# Patient Record
Sex: Female | Born: 1970 | Race: Black or African American | Hispanic: No | Smoking: Current some day smoker
Health system: Southern US, Community
[De-identification: ages and names within clinical notes are randomized; demographics above are authoritative.]

## PROBLEM LIST (undated history)

## (undated) DIAGNOSIS — E785 Hyperlipidemia, unspecified: Secondary | ICD-10-CM

## (undated) DIAGNOSIS — I10 Essential (primary) hypertension: Secondary | ICD-10-CM

## (undated) HISTORY — DX: Essential (primary) hypertension: I10

## (undated) HISTORY — PX: TUBAL LIGATION: SHX77

## (undated) HISTORY — PX: ABDOMINAL SURGERY: SHX537

## (undated) HISTORY — PX: ABDOMINAL HYSTERECTOMY: SHX81

## (undated) HISTORY — DX: Hyperlipidemia, unspecified: E78.5

---

## 1998-09-01 ENCOUNTER — Emergency Department (HOSPITAL_COMMUNITY): Admission: EM | Admit: 1998-09-01 | Discharge: 1998-09-02 | Payer: Self-pay | Admitting: Emergency Medicine

## 1998-09-01 ENCOUNTER — Encounter: Payer: Self-pay | Admitting: Emergency Medicine

## 2000-04-07 ENCOUNTER — Other Ambulatory Visit: Admission: RE | Admit: 2000-04-07 | Discharge: 2000-04-07 | Payer: Self-pay | Admitting: Obstetrics and Gynecology

## 2000-05-17 ENCOUNTER — Other Ambulatory Visit: Admission: RE | Admit: 2000-05-17 | Discharge: 2000-05-17 | Payer: Self-pay | Admitting: Gynecology

## 2000-10-16 ENCOUNTER — Inpatient Hospital Stay (HOSPITAL_COMMUNITY): Admission: AD | Admit: 2000-10-16 | Discharge: 2000-10-21 | Payer: Self-pay | Admitting: Gynecology

## 2000-10-17 ENCOUNTER — Encounter: Payer: Self-pay | Admitting: *Deleted

## 2000-11-30 ENCOUNTER — Other Ambulatory Visit: Admission: RE | Admit: 2000-11-30 | Discharge: 2000-11-30 | Payer: Self-pay | Admitting: Gynecology

## 2001-12-23 ENCOUNTER — Encounter: Payer: Self-pay | Admitting: Emergency Medicine

## 2001-12-23 ENCOUNTER — Emergency Department (HOSPITAL_COMMUNITY): Admission: EM | Admit: 2001-12-23 | Discharge: 2001-12-23 | Payer: Self-pay | Admitting: Emergency Medicine

## 2002-10-03 ENCOUNTER — Other Ambulatory Visit: Admission: RE | Admit: 2002-10-03 | Discharge: 2002-10-03 | Payer: Self-pay | Admitting: Gynecology

## 2003-02-19 ENCOUNTER — Inpatient Hospital Stay (HOSPITAL_COMMUNITY): Admission: AD | Admit: 2003-02-19 | Discharge: 2003-02-19 | Payer: Self-pay | Admitting: Gynecology

## 2003-02-19 ENCOUNTER — Encounter: Payer: Self-pay | Admitting: Gynecology

## 2003-03-14 ENCOUNTER — Observation Stay (HOSPITAL_COMMUNITY): Admission: RE | Admit: 2003-03-14 | Discharge: 2003-03-15 | Payer: Self-pay | Admitting: Gynecology

## 2003-03-14 ENCOUNTER — Encounter: Payer: Self-pay | Admitting: Gynecology

## 2003-03-16 ENCOUNTER — Inpatient Hospital Stay (HOSPITAL_COMMUNITY): Admission: AD | Admit: 2003-03-16 | Discharge: 2003-03-16 | Payer: Self-pay | Admitting: Gynecology

## 2003-07-17 ENCOUNTER — Encounter: Payer: Self-pay | Admitting: Family Medicine

## 2003-07-17 ENCOUNTER — Ambulatory Visit (HOSPITAL_COMMUNITY): Admission: RE | Admit: 2003-07-17 | Discharge: 2003-07-17 | Payer: Self-pay | Admitting: Family Medicine

## 2004-07-12 ENCOUNTER — Emergency Department (HOSPITAL_COMMUNITY): Admission: EM | Admit: 2004-07-12 | Discharge: 2004-07-12 | Payer: Self-pay | Admitting: Emergency Medicine

## 2012-09-25 ENCOUNTER — Emergency Department (INDEPENDENT_AMBULATORY_CARE_PROVIDER_SITE_OTHER)
Admission: EM | Admit: 2012-09-25 | Discharge: 2012-09-25 | Disposition: A | Discharge status: Home / Self Care | Payer: PRIVATE HEALTH INSURANCE | Source: Home / Self Care | Attending: Family Medicine | Admitting: Family Medicine

## 2012-09-25 ENCOUNTER — Encounter (HOSPITAL_COMMUNITY): Payer: Self-pay

## 2012-09-25 DIAGNOSIS — M62830 Muscle spasm of back: Secondary | ICD-10-CM

## 2012-09-25 DIAGNOSIS — M5380 Other specified dorsopathies, site unspecified: Secondary | ICD-10-CM

## 2012-09-25 DIAGNOSIS — B379 Candidiasis, unspecified: Secondary | ICD-10-CM

## 2012-09-25 DIAGNOSIS — B49 Unspecified mycosis: Secondary | ICD-10-CM

## 2012-09-25 MED ORDER — CYCLOBENZAPRINE HCL 10 MG PO TABS: 10.0000 mg | ORAL_TABLET | Freq: Two times a day (BID) | ORAL | Status: DC | PRN

## 2012-09-25 MED ORDER — MUPIROCIN CALCIUM 2 % EX CREA: TOPICAL_CREAM | Freq: Three times a day (TID) | CUTANEOUS | Status: DC

## 2012-09-25 MED ORDER — NYSTATIN 100000 UNIT/GM EX CREA: TOPICAL_CREAM | CUTANEOUS | Status: DC

## 2012-09-25 MED ORDER — HYDROCODONE-ACETAMINOPHEN 5-325 MG PO TABS: 2.0000 | ORAL_TABLET | ORAL | Status: AC | PRN

## 2012-09-25 NOTE — ED Notes (Signed)
C/o painful rash under breast, bilateral; no known ill contact

## 2012-09-25 NOTE — ED Provider Notes (Signed)
History     CSN: [id number]  Arrival date & time 09/25/12  1022   None     Chief Complaint  Patient presents with  . Rash    (Consider location/radiation/quality/duration/timing/severity/associated sxs/prior treatment) Patient is a 41 y.o. female presenting with rash. The history is provided by the patient. No language interpreter was used.  Rash  This is a new problem. The current episode started 2 days ago. The problem has been gradually worsening. The problem is associated with nothing. There has been no fever. The pain is moderate. The pain has been constant since onset. She has tried nothing for the symptoms. The treatment provided moderate relief.  Pt complains of a rash under bilat breast.  Pt reports rash is painful.  Pt also complains of pain in her back  History reviewed. No pertinent past medical history.  Past Surgical History  Procedure Date  . Abdominal hysterectomy     History reviewed. No pertinent family history.  History  Substance Use Topics  . Smoking status: Not on file  . Smokeless tobacco: Not on file  . Alcohol Use:     OB History    Grav Para Term Preterm Abortions TAB SAB Ect Mult Living                  Review of Systems  Musculoskeletal: Positive for back pain.  Skin: Positive for rash.  All other systems reviewed and are negative.    Allergies  Review of patient's allergies indicates no known allergies.  Home Medications  No current outpatient prescriptions on file.  BP 144/92  Pulse 95  Temp 98.3 F (36.8 C) (Oral)  Resp 12  SpO2 98%  Physical Exam  Nursing note and vitals reviewed. Constitutional: She is oriented to person, place, and time. She appears well-developed and well-nourished.  HENT:  Head: Normocephalic.  Eyes: Conjunctivae normal and EOM are normal. Pupils are equal, round, and reactive to light.  Neck: Normal range of motion. Neck supple.  Cardiovascular: Normal rate and regular rhythm.     Pulmonary/Chest: Effort normal.  Abdominal: Soft.  Musculoskeletal:       Pain with range of motion left shoulder,  Tender thoracic paravertebral area  Neurological: She is alert and oriented to person, place, and time.  Skin: Rash noted.       Erythematous area demarcated lines  Psychiatric: She has a normal mood and affect.    ED Course  Procedures (including critical care time)  Labs Reviewed - No data to display No results found.   1. Yeast infection   2. Muscle spasm of back       MDM  Dr. Tressia Danas in to see and examine.  Yeast infection with possible bacterial infection.  I will treat back pain as well.  Pt given primary care referrals        Lonia Skinner [place], [place] 09/25/12 1231

## 2012-09-26 NOTE — ED Provider Notes (Signed)
Medical screening examination/treatment/procedure(s) were performed by non-physician practitioner and as supervising physician I was immediately available for consultation/collaboration.   MORENO-COLL,Ashyla Luth; MD   Rontrell Moquin Moreno-Coll, MD 09/26/12 1206 

## 2013-01-12 ENCOUNTER — Emergency Department (INDEPENDENT_AMBULATORY_CARE_PROVIDER_SITE_OTHER)
Admission: EM | Admit: 2013-01-12 | Discharge: 2013-01-12 | Disposition: A | Discharge status: Home / Self Care | Payer: PRIVATE HEALTH INSURANCE | Source: Home / Self Care | Attending: Emergency Medicine | Admitting: Emergency Medicine

## 2013-01-12 ENCOUNTER — Encounter (HOSPITAL_COMMUNITY): Payer: Self-pay

## 2013-01-12 DIAGNOSIS — R5381 Other malaise: Secondary | ICD-10-CM

## 2013-01-12 DIAGNOSIS — R531 Weakness: Secondary | ICD-10-CM

## 2013-01-12 DIAGNOSIS — R5383 Other fatigue: Secondary | ICD-10-CM

## 2013-01-12 LAB — POCT I-STAT, CHEM 8
Creatinine, Ser: 0.8 mg/dL (ref 0.50–1.10)
Glucose, Bld: 100 mg/dL — ABNORMAL HIGH (ref 70–99)
Hemoglobin: 15 g/dL (ref 12.0–15.0)
Potassium: 4.2 mEq/L (ref 3.5–5.1)
TCO2: 26 mmol/L (ref 0–100)

## 2013-01-12 NOTE — ED Notes (Signed)
C/o she did not feel well this AM, and at work, they checked her BP and felt it to be high, sent here here for checkup; NAD , denies pain at present

## 2013-01-12 NOTE — ED Provider Notes (Signed)
History     CSN: [id number]  Arrival date & time 01/12/13  1020   First MD Initiated Contact with Patient 01/12/13 1053      Chief Complaint  Patient presents with  . Hypertension    (Consider location/radiation/quality/duration/timing/severity/associated sxs/prior treatment) Patient is a 42 y.o. female presenting with weakness. The history is provided by the patient. No language interpreter was used.  Weakness This is a new problem. The current episode started 6 to 12 hours ago. She has tried nothing for the symptoms.  Pt reports she does not feel well today.  Pt had her blood pressure taken at her job and it was slightly elevated.    History reviewed. No pertinent past medical history.  Past Surgical History  Procedure Laterality Date  . Abdominal hysterectomy      History reviewed. No pertinent family history.  History  Substance Use Topics  . Smoking status: Not on file  . Smokeless tobacco: Not on file  . Alcohol Use:     OB History   Grav Para Term Preterm Abortions TAB SAB Ect Mult Living                  Review of Systems  Neurological: Positive for weakness.  All other systems reviewed and are negative.    Allergies  Review of patient's allergies indicates no known allergies.  Home Medications   Current Outpatient Rx  Name  Route  Sig  Dispense  Refill  . cyclobenzaprine (FLEXERIL) 10 MG tablet   Oral   Take 1 tablet (10 mg total) by mouth 2 (two) times daily as needed for muscle spasms.   20 tablet   0   . mupirocin cream (BACTROBAN) 2 %   Topical   Apply topically 3 (three) times daily.   15 g   0   . nystatin cream (MYCOSTATIN)      Apply to affected area 2 times daily   30 g   1     BP 138/95  Pulse 72  Temp(Src) 98.5 F (36.9 C) (Oral)  Resp 18  SpO2 98%  Physical Exam  Constitutional: She is oriented to person, place, and time. She appears well-developed and well-nourished.  HENT:  Head: Normocephalic.  Right Ear:  External ear normal.  Left Ear: External ear normal.  Nose: Nose normal.  Mouth/Throat: Oropharynx is clear and moist.  Eyes: EOM are normal.  Neck: Normal range of motion.  Cardiovascular: Normal rate.   Pulmonary/Chest: Effort normal and breath sounds normal.  Abdominal: Soft.  Musculoskeletal: Normal range of motion.  Neurological: She is alert and oriented to person, place, and time.  Skin: Skin is warm.  Psychiatric: She has a normal mood and affect.    ED Course  Procedures (including critical care time)  Labs Reviewed - No data to display No results found.   No diagnosis found.    MDM  Blood pressure 138/95.   Pt advised to monitor blood pressure,  Decreased sodium.   I stat 8 is normal        Lonia Skinner [place], [place] 01/12/13 1209

## 2013-01-12 NOTE — ED Provider Notes (Signed)
Medical screening examination/treatment/procedure(s) were performed by non-physician practitioner and as supervising physician I was immediately available for consultation/collaboration.  Raynald Blend, MD 01/12/13 1348

## 2015-02-11 ENCOUNTER — Emergency Department (HOSPITAL_COMMUNITY)
Admission: EM | Admit: 2015-02-11 | Discharge: 2015-02-11 | Discharge status: Against Medical Advice | Payer: PRIVATE HEALTH INSURANCE | Attending: Emergency Medicine | Admitting: Emergency Medicine

## 2015-02-11 ENCOUNTER — Emergency Department (INDEPENDENT_AMBULATORY_CARE_PROVIDER_SITE_OTHER)
Admission: EM | Admit: 2015-02-11 | Discharge: 2015-02-11 | Disposition: A | Discharge status: Other Acute Inpatient Hospital | Payer: PRIVATE HEALTH INSURANCE | Source: Home / Self Care | Attending: Family Medicine | Admitting: Family Medicine

## 2015-02-11 ENCOUNTER — Encounter (HOSPITAL_COMMUNITY): Payer: Self-pay

## 2015-02-11 ENCOUNTER — Encounter (HOSPITAL_COMMUNITY): Payer: Self-pay | Admitting: *Deleted

## 2015-02-11 DIAGNOSIS — R51 Headache: Secondary | ICD-10-CM

## 2015-02-11 DIAGNOSIS — R519 Headache, unspecified: Secondary | ICD-10-CM

## 2015-02-11 DIAGNOSIS — R110 Nausea: Secondary | ICD-10-CM | POA: Diagnosis not present

## 2015-02-11 NOTE — ED Notes (Signed)
Pt woke up with headache and has just gotten worse today. Pt had nausea earlier but no vomiting. Has most pain over right eye.

## 2015-02-11 NOTE — ED Provider Notes (Signed)
CSN: [id number]     Arrival date & time 02/11/15  1111 History   First MD Initiated Contact with Patient 02/11/15 1238     Chief Complaint  Patient presents with  . Headache   (Consider location/radiation/quality/duration/timing/severity/associated sxs/prior Treatment) Patient is a 44 y.o. female presenting with headaches. The history is provided by the patient.  Headache Pain location:  R parietal, R temporal and occipital Quality:  Dull Radiates to:  Does not radiate Severity currently:  5/10 Severity at highest:  10/10 Onset quality:  Gradual Progression:  Partially resolved Chronicity:  New Similar to prior headaches: no   Context comment:  Awoke with dull ha and took [place] powder, sx peaked ay 10 am , took another [place] powder, sl blurred vision and nausea, sx sl resolved at present. overall worst ha ever with change in usual sx. Associated symptoms: nausea   Associated symptoms: no dizziness, no fever, no numbness and no weakness     History reviewed. No pertinent past medical history. Past Surgical History  Procedure Laterality Date  . Abdominal hysterectomy     History reviewed. No pertinent family history. History  Substance Use Topics  . Smoking status: Never Smoker   . Smokeless tobacco: Not on file  . Alcohol Use: Yes   OB History    No data available     Review of Systems  Constitutional: Negative for fever.  Gastrointestinal: Positive for nausea.  Genitourinary: Negative for menstrual problem.  Neurological: Positive for headaches. Negative for dizziness, facial asymmetry, speech difficulty, weakness, light-headedness and numbness.    Allergies  Review of patient's allergies indicates no known allergies.  Home Medications   Prior to Admission medications   Medication Sig Start Date End Date Taking? Authorizing Provider  cyclobenzaprine (FLEXERIL) 10 MG tablet Take 1 tablet (10 mg total) by mouth 2 (two) times daily as needed for muscle spasms.  09/25/12   Elson AreasLeslie K Sofia, PA-C  mupirocin cream (BACTROBAN) 2 % Apply topically 3 (three) times daily. 09/25/12   Elson AreasLeslie K Sofia, PA-C  nystatin cream (MYCOSTATIN) Apply to affected area 2 times daily 09/25/12   Elson AreasLeslie K Sofia, PA-C   BP 151/92 mmHg  Pulse 85  Temp(Src) 98.4 F (36.9 C) (Oral)  Resp 18  SpO2 98% Physical Exam  Constitutional: She is oriented to person, place, and time. She appears well-developed and well-nourished. No distress.  HENT:  Right Ear: External ear normal.  Left Ear: External ear normal.  Eyes: Conjunctivae and EOM are normal. Pupils are equal, round, and reactive to light.  Neck: Normal range of motion. Neck supple.  Cardiovascular: Normal rate and normal heart sounds.   Pulmonary/Chest: Effort normal and breath sounds normal.  Lymphadenopathy:    She has no cervical adenopathy.  Neurological: She is alert and oriented to person, place, and time. She has normal reflexes. She displays normal reflexes. No cranial nerve deficit. Coordination normal.  Skin: Skin is warm and dry.  Nursing note and vitals reviewed.   ED Course  Procedures (including critical care time) Labs Review Labs Reviewed - No data to display  Imaging Review No results found.   MDM   1. Occipital headache    Sent for ct eval of worst ever 10/10 atypical right sided heache.Linna Hoff.    James D Kindl, MD 02/11/15 1302

## 2015-02-11 NOTE — ED Notes (Signed)
Pt.has been called x2 no anwser

## 2015-02-11 NOTE — ED Notes (Signed)
Pt  Reports  Headache  r  Upper    Forehead       With  Nausea       And  Blurred  Vision             Symptoms  Started  1000 am          -  Pt  Reports          The   Symptoms        Are  actually  somehat  Better  At this  Time           Pt  Sitting  Upright  On  Exam table          Pt  Reports        Feels  A  Sensation of  Pressure

## 2015-11-18 DIAGNOSIS — K219 Gastro-esophageal reflux disease without esophagitis: Secondary | ICD-10-CM | POA: Insufficient documentation

## 2015-12-02 DIAGNOSIS — E663 Overweight: Secondary | ICD-10-CM | POA: Insufficient documentation

## 2015-12-02 DIAGNOSIS — E7800 Pure hypercholesterolemia, unspecified: Secondary | ICD-10-CM | POA: Insufficient documentation

## 2016-03-04 DIAGNOSIS — R748 Abnormal levels of other serum enzymes: Secondary | ICD-10-CM | POA: Insufficient documentation

## 2016-03-04 DIAGNOSIS — I10 Essential (primary) hypertension: Secondary | ICD-10-CM | POA: Insufficient documentation

## 2018-02-07 ENCOUNTER — Other Ambulatory Visit: Payer: Self-pay

## 2018-02-07 ENCOUNTER — Ambulatory Visit: Payer: BLUE CROSS/BLUE SHIELD | Admitting: Obstetrics and Gynecology

## 2018-02-07 ENCOUNTER — Encounter: Payer: Self-pay | Admitting: Obstetrics and Gynecology

## 2018-02-07 ENCOUNTER — Telehealth: Payer: Self-pay | Admitting: *Deleted

## 2018-02-07 ENCOUNTER — Ambulatory Visit (HOSPITAL_COMMUNITY)
Admission: RE | Admit: 2018-02-07 | Discharge: 2018-02-07 | Disposition: A | Discharge status: Home / Self Care | Payer: BLUE CROSS/BLUE SHIELD | Source: Ambulatory Visit | Attending: Obstetrics and Gynecology | Admitting: Obstetrics and Gynecology

## 2018-02-07 ENCOUNTER — Other Ambulatory Visit: Payer: Self-pay | Admitting: Obstetrics and Gynecology

## 2018-02-07 VITALS — BP 128/84 | HR 90 | Resp 16 | Ht 66.0 in | Wt 180.0 lb

## 2018-02-07 DIAGNOSIS — N83202 Unspecified ovarian cyst, left side: Secondary | ICD-10-CM

## 2018-02-07 DIAGNOSIS — R102 Pelvic and perineal pain: Secondary | ICD-10-CM

## 2018-02-07 DIAGNOSIS — Z90710 Acquired absence of both cervix and uterus: Secondary | ICD-10-CM | POA: Insufficient documentation

## 2018-02-07 DIAGNOSIS — R109 Unspecified abdominal pain: Secondary | ICD-10-CM | POA: Diagnosis not present

## 2018-02-07 DIAGNOSIS — N949 Unspecified condition associated with female genital organs and menstrual cycle: Secondary | ICD-10-CM

## 2018-02-07 DIAGNOSIS — N9489 Other specified conditions associated with female genital organs and menstrual cycle: Secondary | ICD-10-CM

## 2018-02-07 NOTE — Telephone Encounter (Signed)
Calling with report for patient, advised "no left ovarian torsion", report available in Epic. Advised will review with Dr. Oscar LaJertson and return call.   Reviewed with Dr. Oscar LaJertson, patient can leave hospital, will f/u with patient once lab results return, return call to office with any additional concerns.   [facility], advised as seen above.   Routing to Dr. Oscar LaJertson. Will close encounter.

## 2018-02-07 NOTE — Progress Notes (Signed)
47 y.o. [id number] SingleAfrican AmericanF here to follow up on ovarian cyst. Patient c/o ongoing pelvic pain  The patient was seen in the ER yesterday c/o a 3 day h/o LLQ abdominal pain. CT scan returned: # Reproductive organs: Small amount of fluid present in the cul-de-sac. There is a multiloculated cystic mass or several adjacent cysts in the left adnexa measuring in total about 40 x 86 mm on image 116. Largest single cyst or loculated measures 42 x  69 mm.Marland Kitchen Hysterectomy. She had a normal CBC.   The patient c/o intermittent low level pain in the LLQ for years. On Saturday she woke up with pain in her left side and LLQ. Over the course of the day the pain got worse. She had associated nausea and emesis. She was seen in urgent care, no imaging available so she went home. Since then she has baseline cramping/aching pain that is a 4/10 in severity. When it gets bad it is a 10/10 in severity. The severe pain lasts for a minute or so. The severe pain is less often than it was this weekend. More aching/cramping/heavy.   Sexually active without pain in the last few year, not sexually active since this weekend.  No fevers, not currently having nausea, no emesis in the last few days. No appetite, minimal po intake. No BM since Friday, typically has one every day.  No urinary c/o.   H/O TAH about 18 years ago. No hot flashes, night sweats or vaginal dryness.      No LMP recorded. Patient has had a hysterectomy.          Sexually active: Yes.    The current method of family planning is status post hysterectomy.    Exercising: Yes.    kick boxing  Smoker:  Former smoker   Health Maintenance: Pap:  18 years ago History of abnormal Pap:  No MMG:  10/26/2017 WNL  Colonoscopy:  Never BMD:   Never TDaP:  10-17-15 Gardasil: N/A   reports that she has quit smoking. Her smoking use included cigarettes. She has a 10.00 pack-year smoking history. she has never used smokeless tobacco. She reports that she  drinks about 1.2 oz of alcohol per week. She reports that she does not use drugs. Has 5 kids.   Past Medical History:  Diagnosis Date  . Hyperlipidemia   . Hypertension     Past Surgical History:  Procedure Laterality Date  . ABDOMINAL HYSTERECTOMY    . ABDOMINAL SURGERY    . CESAREAN SECTION    . TUBAL LIGATION      Current Outpatient Medications  Medication Sig Dispense Refill  . atorvastatin (LIPITOR) 10 MG tablet Take 1 tablet by mouth daily.    . calcium-vitamin D (OSCAL WITH D) 500-200 MG-UNIT TABS tablet Take by mouth.    . diclofenac (VOLTAREN) 75 MG EC tablet Take 75 mg by mouth 2 (two) times daily.  0  . lisinopril (PRINIVIL,ZESTRIL) 10 MG tablet Take 10 mg by mouth daily.  5  . Probiotic Product (PROBIOTIC-10) CAPS Take by mouth.    . traMADol (ULTRAM) 50 MG tablet Take by mouth.     No current facility-administered medications for this visit.     Family History  Problem Relation Age of Onset  . Thyroid disease Mother   . Heart disease Maternal Grandmother   . Diabetes Maternal Grandmother   . Diabetes Paternal Grandmother     Review of Systems  Constitutional: Negative.   HENT: Negative.  Eyes: Negative.   Respiratory: Negative.   Cardiovascular: Negative.   Gastrointestinal: Negative.   Endocrine: Negative.   Genitourinary: Positive for pelvic pain.  Musculoskeletal: Negative.   Skin: Negative.   Allergic/Immunologic: Negative.   Neurological: Negative.   Psychiatric/Behavioral: Negative.     Exam:   BP 128/84 (BP Location: Right Arm, Patient Position: Sitting, Cuff Size: Normal)   Pulse 90   Resp 16   Ht 5\' 6"  (1.676 m)   Wt 180 lb (81.6 kg)   BMI 29.05 kg/m   Weight change: @WEIGHTCHANGE @ Height:   Height: 5\' 6"  (167.6 cm)  Ht Readings from Last 3 Encounters:  02/07/18 5\' 6"  (1.676 m)  02/11/15 5\' 5"  (1.651 m)    General appearance: alert, cooperative and appears stated age Head: Normocephalic, without obvious abnormality,  atraumatic Neck: no adenopathy, supple, symmetrical, trachea midline and thyroid normal to inspection and palpation Lungs: clear to auscultation bilaterally Cardiovascular: regular rate and rhythm Abdomen: soft, tender in the LLQ, no guarding, +/- rebound. Non distended,  no masses,  no organomegaly Extremities: extremities normal, atraumatic, no cyanosis or edema Skin: Skin color, texture, turgor normal. No rashes or lesions Lymph nodes: Cervical, supraclavicular, and axillary nodes normal. No abnormal inguinal nodes palpated Neurologic: Grossly normal   Pelvic: External genitalia:  no lesions              Urethra:  normal appearing urethra with no masses, tenderness or lesions              Bartholins and Skenes: normal                 Vagina: normal appearing vagina with normal color and discharge, no lesions              Cervix: absent               Bimanual Exam:  Uterus:  uterus absent              Adnexa: very tender in the left adnexa limiting exam               Rectovaginal: tender mass appreciated on the left               Anus:  normal sphincter tone, no lesions  Chaperone was present for exam.  A:  LLQ abdominal/pelvic pain  Multicystic left adnexal mass, 8 cm on CT yesterday  H/O Hysterectomy  P:   Ultrasound with dopplers today to r/o torsion  FSH, CA 125  CBC with diff   Addendum: Ultrasound results were called to the office: IMPRESSION: Post hysterectomy.  Nonvisualization of RIGHT ovary.  Large mildly complicated cyst within LEFT ovary 7.5 x 4.2 x 4.0 cm in size with thin septations as well as a questionable slightly thicker irregular septation versus intervening soft tissue between the cystic lesion and additional smaller cysts within the LEFT ovary; based on size and appearance, consider surgical management.  No evidence of LEFT ovarian torsion.  I reviewed the images myself.   I called the patient, reviewed results. Will await lab work to make  further plans. She was advised to call with severe pain, [facility]

## 2018-02-07 NOTE — Progress Notes (Signed)
[facility] with [facility]. Patient scheduled today for STAT US pelvis complete [facility]. [facility], will hold after US and call office with report. Patient to arrive with full bladder. Reviewed with patient, agreeable to plan. Labs drawn in office. Orders placed.

## 2018-02-08 ENCOUNTER — Telehealth: Payer: Self-pay

## 2018-02-08 LAB — CBC
HEMATOCRIT: 38 % (ref 34.0–46.6)
Hemoglobin: 12.6 g/dL (ref 11.1–15.9)
MCH: 30.4 pg (ref 26.6–33.0)
MCHC: 33.2 g/dL (ref 31.5–35.7)
MCV: 92 fL (ref 79–97)
PLATELETS: 308 10*3/uL (ref 150–379)
RBC: 4.15 x10E6/uL (ref 3.77–5.28)
RDW: 13.6 % (ref 12.3–15.4)
WBC: 5.5 10*3/uL (ref 3.4–10.8)

## 2018-02-08 LAB — FOLLICLE STIMULATING HORMONE: FSH: 63 m[IU]/mL

## 2018-02-08 LAB — CA 125: Cancer Antigen (CA) 125: 9.7 U/mL (ref 0.0–38.1)

## 2018-02-08 NOTE — Telephone Encounter (Signed)
Spoke with patient. Advised of results as seen below. Patient verbalizes understanding. States that her pain has remained the same. Is taking Ibuprofen with little relief. Without Ibuprofen pain is 10/10, with it is 8/10. Denies any new symptoms. Denies fever, chills, nausea or vomiting. Advised will review with Dr.Jertson and return call with any additional recommendations.

## 2018-02-08 NOTE — Telephone Encounter (Signed)
Spoke to patient approximately 6pm. Reviewed options of elective scheduling versus emergent ED admission.  Patient prefers watchful waiting for now but aware to call and/or go directly to [facility] for worsening symptoms.  Will plan for scheduled procedure on Tuesday, 02-14-18 at 1030 [facility]. Surgery instructions reviewed. Advised will contact back tomorrow with confirmation and update on pain.

## 2018-02-08 NOTE — Telephone Encounter (Signed)
I spoke with the patient, her pain is severe, a little better than last night. I reviewed the images and lab work with Dr Edward JollySilva, she agrees with me that it appears benign. I spoke with the patient, I'm concerned that she may be intermittently torsing her ovary. Recommend laparoscopy with LSO, would also remove her right tube. Possible cystoscopy. I reviewed the risks of infection, bleeding, damage to bowel or bladder, damage to ureters.  I discussed the small chance of malignancy or need for further surgery. I discussed same day surgery and 2 weeks out of work.   Discussed surgery in the am, vs surgery early next week. She prefers in the am.

## 2018-02-08 NOTE — Telephone Encounter (Signed)
[facility] tomorrow. Candice RuddySally Yeakley reviewed options with the patient of going to the ER or scheduling surgery next week. She would prefer next week. She understands with worsening pain [facility].

## 2018-02-08 NOTE — Telephone Encounter (Signed)
-----   Message from Romualdo BolkJill Evelyn Jertson, MD sent at 02/08/2018  9:53 AM EDT ----- Please let the patient know that her CA 125 is normal, her FSH is in a menopausal range (which means she is either peri, or postmenopausal) and her CBC was normal.  I'm reviewing her ultrasound images with the Oncologist and will get back to her. Please see how she is feeling.

## 2018-02-09 ENCOUNTER — Telehealth: Payer: Self-pay | Admitting: Obstetrics and Gynecology

## 2018-02-09 ENCOUNTER — Other Ambulatory Visit: Payer: Self-pay | Admitting: Obstetrics and Gynecology

## 2018-02-09 MED ORDER — OXYCODONE-ACETAMINOPHEN 5-325 MG PO TABS: 1.0000 | ORAL_TABLET | ORAL | 0 refills | Status: DC | PRN

## 2018-02-09 NOTE — Telephone Encounter (Signed)
See other phone note dated today and please let me know how the patient is doing.

## 2018-02-09 NOTE — Telephone Encounter (Signed)
Call to patient. States pain is " the same"  And she is "dealing with it." Maybe slightly more toward right but is not any worse. Advised of Percocet called to [facility]. This is for Monday in place of Ibuprofen and post-op pain.  Advised if pain worsens before Monday, she needs to call.  Reviewed procedure planned, Lap LSO., RS and possible Cysto.  Routing to provider for final review. Patient agreeable to disposition. Will close encounter.

## 2018-02-09 NOTE — H&P (Signed)
47 y.o. [id number] SingleAfrican AmericanF here to follow up on ovarian cyst. Patient c/o ongoing pelvic pain  The patient was seen in the ER yesterday c/o a 3 day h/o LLQ abdominal pain. CT scan returned: # Reproductive organs: Small amount of fluid present in the cul-de-sac. There is a multiloculated cystic mass or several adjacent cysts in the left adnexa measuring in total about 40 x 86 mm on image 116. Largest single cyst or loculated measures 42 x  69 mm.Marland Kitchen. Hysterectomy. She had a normal CBC.   The patient c/o intermittent low level pain in the LLQ for years. On Saturday she woke up with pain in her left side and LLQ. Over the course of the day the pain got worse. She had associated nausea and emesis. She was seen in urgent care, no imaging available so she went home. Since then she has baseline cramping/aching pain that is a 4/10 in severity. When it gets bad it is a 10/10 in severity. The severe pain lasts for a minute or so. The severe pain is less often than it was this weekend. More aching/cramping/heavy.   Sexually active without pain in the last few year, not sexually active since this weekend.  No fevers, not currently having nausea, no emesis in the last few days. No appetite, minimal po intake. No BM since Friday, typically has one every day.  No urinary c/o.   H/O TAH about 18 years ago. No hot flashes, night sweats or vaginal dryness.    No LMP recorded. Patient has had a hysterectomy.          Sexually active: Yes.    The current method of family planning is status post hysterectomy.    Exercising: Yes.    kick boxing  Smoker:  Former smoker   Health Maintenance: Pap:  18 years ago History of abnormal Pap:  No MMG:  10/26/2017 WNL  Colonoscopy:  Never BMD:   Never TDaP:  10-17-15 Gardasil: N/A   reports that she has quit smoking. Her smoking use included cigarettes. She has a 10.00 pack-year smoking history. she has never used smokeless tobacco. She reports that  she drinks about 1.2 oz of alcohol per week. She reports that she does not use drugs. Has 5 kids.       Past Medical History:  Diagnosis Date  . Hyperlipidemia   . Hypertension          Past Surgical History:  Procedure Laterality Date  . ABDOMINAL HYSTERECTOMY    . ABDOMINAL SURGERY    . CESAREAN SECTION    . TUBAL LIGATION            Current Outpatient Medications  Medication Sig Dispense Refill  . atorvastatin (LIPITOR) 10 MG tablet Take 1 tablet by mouth daily.    . calcium-vitamin D (OSCAL WITH D) 500-200 MG-UNIT TABS tablet Take by mouth.    . diclofenac (VOLTAREN) 75 MG EC tablet Take 75 mg by mouth 2 (two) times daily.  0  . lisinopril (PRINIVIL,ZESTRIL) 10 MG tablet Take 10 mg by mouth daily.  5  . Probiotic Product (PROBIOTIC-10) CAPS Take by mouth.    . traMADol (ULTRAM) 50 MG tablet Take by mouth.     No current facility-administered medications for this visit.          Family History  Problem Relation Age of Onset  . Thyroid disease Mother   . Heart disease Maternal Grandmother   . Diabetes Maternal Grandmother   .  Diabetes Paternal Grandmother     Review of Systems  Constitutional: Negative.   HENT: Negative.   Eyes: Negative.   Respiratory: Negative.   Cardiovascular: Negative.   Gastrointestinal: Negative.   Endocrine: Negative.   Genitourinary: Positive for pelvic pain.  Musculoskeletal: Negative.   Skin: Negative.   Allergic/Immunologic: Negative.   Neurological: Negative.   Psychiatric/Behavioral: Negative.     Exam:   BP 128/84 (BP Location: Right Arm, Patient Position: Sitting, Cuff Size: Normal)   Pulse 90   Resp 16   Ht 5\' 6"  (1.676 m)   Wt 180 lb (81.6 kg)   BMI 29.05 kg/m   Weight change: @WEIGHTCHANGE @ Height:   Height: 5\' 6"  (167.6 cm)     Ht Readings from Last 3 Encounters:  02/07/18 5\' 6"  (1.676 m)  02/11/15 5\' 5"  (1.651 m)    General appearance: alert, cooperative and appears  stated age Head: Normocephalic, without obvious abnormality, atraumatic Neck: no adenopathy, supple, symmetrical, trachea midline and thyroid normal to inspection and palpation Lungs: clear to auscultation bilaterally Cardiovascular: regular rate and rhythm Abdomen: soft, tender in the LLQ, no guarding, +/- rebound. Non distended,  no masses,  no organomegaly Extremities: extremities normal, atraumatic, no cyanosis or edema Skin: Skin color, texture, turgor normal. No rashes or lesions Lymph nodes: Cervical, supraclavicular, and axillary nodes normal. No abnormal inguinal nodes palpated Neurologic: Grossly normal   Pelvic: External genitalia:  no lesions              Urethra:  normal appearing urethra with no masses, tenderness or lesions              Bartholins and Skenes: normal                 Vagina: normal appearing vagina with normal color and discharge, no lesions              Cervix: absent               Bimanual Exam:  Uterus:  uterus absent              Adnexa: very tender in the left adnexa limiting exam               Rectovaginal: tender mass appreciated on the left               Anus:  normal sphincter tone, no lesions  Chaperone was present for exam.  A:         LLQ abdominal/pelvic pain             Multicystic left adnexal mass, 8 cm on CT yesterday             H/O Hysterectomy  P:         Ultrasound with dopplers today to r/o torsion             FSH, CA 125             CBC with diff              Addendum: Ultrasound results were called to the office: IMPRESSION: Post hysterectomy.  Nonvisualization of RIGHT ovary.  Large mildly complicated cyst within LEFT ovary 7.5 x 4.2 x 4.0 cm in size with thin septations as well as a questionable slightly thicker irregular septation versus intervening soft tissue between the cystic lesion and additional smaller cysts within the LEFT ovary; based on size and appearance, consider surgical management.  No  evidence  of LEFT ovarian torsion.  Normal CBC, CA 125 and PMP FSH

## 2018-02-09 NOTE — Telephone Encounter (Signed)
See next phone encounter. Encounter closed.   

## 2018-02-09 NOTE — Telephone Encounter (Signed)
Please let the patient know that she shouldn't take ibuprofen for 24 hours prior to surgery. I have called in script for percocet for her to take the day before and after surgery as needed.  Please let her know that in addition to removing her left tube and ovary, I will also remove her right tube (decreases her risk going forward).

## 2018-02-09 NOTE — Progress Notes (Signed)
Script for percocet sent. Please see telephone note 02/09/18

## 2018-02-13 ENCOUNTER — Encounter (HOSPITAL_COMMUNITY): Payer: Self-pay | Admitting: Anesthesiology

## 2018-02-13 ENCOUNTER — Telehealth: Payer: Self-pay | Admitting: Obstetrics and Gynecology

## 2018-02-13 NOTE — Telephone Encounter (Signed)
Call placed to patient to review benefits for scheduled surgery. Left voicemail message requesting a return call °

## 2018-02-14 ENCOUNTER — Ambulatory Visit (HOSPITAL_COMMUNITY): Payer: BLUE CROSS/BLUE SHIELD | Admitting: Anesthesiology

## 2018-02-14 ENCOUNTER — Telehealth: Payer: Self-pay | Admitting: Obstetrics and Gynecology

## 2018-02-14 ENCOUNTER — Encounter (HOSPITAL_COMMUNITY)
Admission: RE | Disposition: A | Discharge status: Home / Self Care | Payer: Self-pay | Source: Ambulatory Visit | Attending: Obstetrics and Gynecology

## 2018-02-14 ENCOUNTER — Ambulatory Visit (HOSPITAL_COMMUNITY)
Admission: RE | Admit: 2018-02-14 | Discharge: 2018-02-14 | Disposition: A | Discharge status: Home / Self Care | Payer: BLUE CROSS/BLUE SHIELD | Source: Ambulatory Visit | Attending: Obstetrics and Gynecology | Admitting: Obstetrics and Gynecology

## 2018-02-14 ENCOUNTER — Other Ambulatory Visit: Payer: Self-pay

## 2018-02-14 ENCOUNTER — Encounter (HOSPITAL_COMMUNITY): Payer: Self-pay

## 2018-02-14 DIAGNOSIS — Z780 Asymptomatic menopausal state: Secondary | ICD-10-CM | POA: Diagnosis not present

## 2018-02-14 DIAGNOSIS — E785 Hyperlipidemia, unspecified: Secondary | ICD-10-CM | POA: Insufficient documentation

## 2018-02-14 DIAGNOSIS — Z90710 Acquired absence of both cervix and uterus: Secondary | ICD-10-CM | POA: Insufficient documentation

## 2018-02-14 DIAGNOSIS — N83202 Unspecified ovarian cyst, left side: Secondary | ICD-10-CM | POA: Diagnosis present

## 2018-02-14 DIAGNOSIS — Z79899 Other long term (current) drug therapy: Secondary | ICD-10-CM | POA: Diagnosis not present

## 2018-02-14 DIAGNOSIS — R1909 Other intra-abdominal and pelvic swelling, mass and lump: Secondary | ICD-10-CM | POA: Diagnosis not present

## 2018-02-14 DIAGNOSIS — Z87891 Personal history of nicotine dependence: Secondary | ICD-10-CM | POA: Insufficient documentation

## 2018-02-14 DIAGNOSIS — R102 Pelvic and perineal pain: Secondary | ICD-10-CM | POA: Diagnosis not present

## 2018-02-14 DIAGNOSIS — N83522 Torsion of left fallopian tube: Secondary | ICD-10-CM | POA: Diagnosis not present

## 2018-02-14 DIAGNOSIS — N736 Female pelvic peritoneal adhesions (postinfective): Secondary | ICD-10-CM | POA: Diagnosis not present

## 2018-02-14 DIAGNOSIS — I10 Essential (primary) hypertension: Secondary | ICD-10-CM | POA: Insufficient documentation

## 2018-02-14 HISTORY — PX: LAPAROSCOPIC LYSIS OF ADHESIONS: SHX5905

## 2018-02-14 HISTORY — PX: LAPAROSCOPIC UNILATERAL SALPINGECTOMY: SHX5934

## 2018-02-14 LAB — COMPREHENSIVE METABOLIC PANEL
ALBUMIN: 3.9 g/dL (ref 3.5–5.0)
ALT: 61 U/L — ABNORMAL HIGH (ref 14–54)
AST: 29 U/L (ref 15–41)
Alkaline Phosphatase: 105 U/L (ref 38–126)
Anion gap: 11 (ref 5–15)
BUN: 11 mg/dL (ref 6–20)
CHLORIDE: 102 mmol/L (ref 101–111)
CO2: 24 mmol/L (ref 22–32)
Calcium: 9.3 mg/dL (ref 8.9–10.3)
Creatinine, Ser: 0.82 mg/dL (ref 0.44–1.00)
GFR calc Af Amer: >60 mL/min (ref >60)
Glucose, Bld: 107 mg/dL — ABNORMAL HIGH (ref 65–99)
POTASSIUM: 4 mmol/L (ref 3.5–5.1)
Sodium: 137 mmol/L (ref 135–145)
Total Bilirubin: 0.5 mg/dL (ref 0.3–1.2)
Total Protein: 8.7 g/dL — ABNORMAL HIGH (ref 6.5–8.1)

## 2018-02-14 LAB — CBC WITH DIFFERENTIAL/PLATELET
BASOS ABS: 0 10*3/uL (ref 0.0–0.1)
BASOS PCT: 0 %
Eosinophils Absolute: 0.1 10*3/uL (ref 0.0–0.7)
Eosinophils Relative: 1 %
HCT: 38.5 % (ref 36.0–46.0)
Hemoglobin: 13.1 g/dL (ref 12.0–15.0)
Lymphocytes Relative: 33 %
Lymphs Abs: 1.7 10*3/uL (ref 0.7–4.0)
MCH: 30.5 pg (ref 26.0–34.0)
MCHC: 34 g/dL (ref 30.0–36.0)
MCV: 89.5 fL (ref 78.0–100.0)
MONO ABS: 0.1 10*3/uL (ref 0.1–1.0)
Monocytes Relative: 3 %
Neutro Abs: 3.3 10*3/uL (ref 1.7–7.7)
Neutrophils Relative %: 63 %
Platelets: 364 10*3/uL (ref 150–400)
RBC: 4.3 MIL/uL (ref 3.87–5.11)
RDW: 12.9 % (ref 11.5–15.5)
WBC: 5.3 10*3/uL (ref 4.0–10.5)

## 2018-02-14 SURGERY — SALPINGO-OOPHORECTOMY, LAPAROSCOPIC
Anesthesia: General | Laterality: Right

## 2018-02-14 SURGERY — SALPINGECTOMY, UNILATERAL, LAPAROSCOPIC
Anesthesia: General | Site: Abdomen

## 2018-02-14 MED ORDER — ROCURONIUM BROMIDE 100 MG/10ML IV SOLN
INTRAVENOUS | Status: DC | PRN
  Administered 2018-02-14: 50 mg via INTRAVENOUS
  Administered 2018-02-14: 10 mg via INTRAVENOUS

## 2018-02-14 MED ORDER — DEXAMETHASONE SODIUM PHOSPHATE 10 MG/ML IJ SOLN
INTRAMUSCULAR | Status: AC
  Filled 2018-02-14: qty 1

## 2018-02-14 MED ORDER — FENTANYL CITRATE (PF) 250 MCG/5ML IJ SOLN
INTRAMUSCULAR | Status: AC
Start: 2018-02-14 — End: ?
  Filled 2018-02-14: qty 5

## 2018-02-14 MED ORDER — LACTATED RINGERS IV SOLN
INTRAVENOUS | Status: DC
  Administered 2018-02-14: 100 mL/h via INTRAVENOUS
  Administered 2018-02-14: 11:00:00 via INTRAVENOUS

## 2018-02-14 MED ORDER — HYDROMORPHONE HCL 1 MG/ML IJ SOLN
INTRAMUSCULAR | Status: AC
  Filled 2018-02-14: qty 0.5

## 2018-02-14 MED ORDER — PROPOFOL 10 MG/ML IV BOLUS
INTRAVENOUS | Status: AC
  Filled 2018-02-14: qty 20

## 2018-02-14 MED ORDER — LACTATED RINGERS IV SOLN: INTRAVENOUS | Status: DC

## 2018-02-14 MED ORDER — SODIUM CHLORIDE 0.9 % IR SOLN
Status: DC | PRN
  Administered 2018-02-14: 3000 mL

## 2018-02-14 MED ORDER — KETOROLAC TROMETHAMINE 30 MG/ML IJ SOLN
INTRAMUSCULAR | Status: DC | PRN
  Administered 2018-02-14: 30 mg via INTRAVENOUS

## 2018-02-14 MED ORDER — SCOPOLAMINE 1 MG/3DAYS TD PT72
1.0000 | MEDICATED_PATCH | Freq: Once | TRANSDERMAL | Status: DC
  Administered 2018-02-14: 1.5 mg via TRANSDERMAL

## 2018-02-14 MED ORDER — ONDANSETRON HCL 4 MG/2ML IJ SOLN
INTRAMUSCULAR | Status: AC
  Filled 2018-02-14: qty 2

## 2018-02-14 MED ORDER — SCOPOLAMINE 1 MG/3DAYS TD PT72
MEDICATED_PATCH | TRANSDERMAL | Status: AC
  Filled 2018-02-14: qty 1

## 2018-02-14 MED ORDER — MEPERIDINE HCL 25 MG/ML IJ SOLN: 6.2500 mg | INTRAMUSCULAR | Status: DC | PRN

## 2018-02-14 MED ORDER — LIDOCAINE HCL (CARDIAC) 20 MG/ML IV SOLN
INTRAVENOUS | Status: AC
  Filled 2018-02-14: qty 5

## 2018-02-14 MED ORDER — MIDAZOLAM HCL 2 MG/2ML IJ SOLN
INTRAMUSCULAR | Status: AC
Start: 2018-02-14 — End: ?
  Filled 2018-02-14: qty 2

## 2018-02-14 MED ORDER — ROCURONIUM BROMIDE 100 MG/10ML IV SOLN
INTRAVENOUS | Status: AC
  Filled 2018-02-14: qty 1

## 2018-02-14 MED ORDER — HYDROMORPHONE HCL 1 MG/ML IJ SOLN
INTRAMUSCULAR | Status: DC | PRN
  Administered 2018-02-14 (×2): 1 mg via INTRAVENOUS

## 2018-02-14 MED ORDER — HYDROMORPHONE HCL 1 MG/ML IJ SOLN
0.2500 mg | INTRAMUSCULAR | Status: DC | PRN
  Administered 2018-02-14 (×2): 0.25 mg via INTRAVENOUS

## 2018-02-14 MED ORDER — ONDANSETRON HCL 4 MG/2ML IJ SOLN
INTRAMUSCULAR | Status: DC | PRN
  Administered 2018-02-14: 4 mg via INTRAVENOUS

## 2018-02-14 MED ORDER — ENOXAPARIN SODIUM 40 MG/0.4ML ~~LOC~~ SOLN: 40.0000 mg | SUBCUTANEOUS | Status: DC

## 2018-02-14 MED ORDER — SODIUM CHLORIDE 0.9 % IJ SOLN
INTRAMUSCULAR | Status: DC | PRN
  Administered 2018-02-14: 10 mL

## 2018-02-14 MED ORDER — FENTANYL CITRATE (PF) 100 MCG/2ML IJ SOLN
INTRAMUSCULAR | Status: DC | PRN
  Administered 2018-02-14: 50 ug via INTRAVENOUS
  Administered 2018-02-14 (×2): 100 ug via INTRAVENOUS

## 2018-02-14 MED ORDER — SUGAMMADEX SODIUM 200 MG/2ML IV SOLN
INTRAVENOUS | Status: DC | PRN
  Administered 2018-02-14: 170 mg via INTRAVENOUS

## 2018-02-14 MED ORDER — BUPIVACAINE HCL (PF) 0.25 % IJ SOLN
INTRAMUSCULAR | Status: DC | PRN
  Administered 2018-02-14: 7 mL

## 2018-02-14 MED ORDER — ACETAMINOPHEN 160 MG/5ML PO SOLN
ORAL | Status: AC
  Administered 2018-02-14: 975 mg
  Filled 2018-02-14: qty 40.6

## 2018-02-14 MED ORDER — HYDROMORPHONE HCL 1 MG/ML IJ SOLN
INTRAMUSCULAR | Status: AC
  Filled 2018-02-14: qty 1

## 2018-02-14 MED ORDER — PROPOFOL 10 MG/ML IV BOLUS
INTRAVENOUS | Status: DC | PRN
  Administered 2018-02-14: 160 mg via INTRAVENOUS
  Administered 2018-02-14: 30 mg via INTRAVENOUS

## 2018-02-14 MED ORDER — ENOXAPARIN SODIUM 40 MG/0.4ML ~~LOC~~ SOLN
40.0000 mg | SUBCUTANEOUS | Status: AC
  Administered 2018-02-14: 40 mg via SUBCUTANEOUS

## 2018-02-14 MED ORDER — OXYCODONE HCL 5 MG PO TABS: 5.0000 mg | ORAL_TABLET | Freq: Once | ORAL | Status: DC | PRN

## 2018-02-14 MED ORDER — ENOXAPARIN SODIUM 40 MG/0.4ML ~~LOC~~ SOLN
SUBCUTANEOUS | Status: AC
  Filled 2018-02-14: qty 0.4

## 2018-02-14 MED ORDER — KETOROLAC TROMETHAMINE 30 MG/ML IJ SOLN
INTRAMUSCULAR | Status: AC
  Filled 2018-02-14: qty 1

## 2018-02-14 MED ORDER — DEXAMETHASONE SODIUM PHOSPHATE 10 MG/ML IJ SOLN
INTRAMUSCULAR | Status: DC | PRN
  Administered 2018-02-14: 10 mg via INTRAVENOUS

## 2018-02-14 MED ORDER — LIDOCAINE HCL (CARDIAC) 20 MG/ML IV SOLN
INTRAVENOUS | Status: DC | PRN
  Administered 2018-02-14: 60 mg via INTRAVENOUS

## 2018-02-14 MED ORDER — GLYCOPYRROLATE 0.2 MG/ML IJ SOLN
INTRAMUSCULAR | Status: DC | PRN
  Administered 2018-02-14: .1 mg via INTRAVENOUS

## 2018-02-14 MED ORDER — PROMETHAZINE HCL 25 MG/ML IJ SOLN: 6.2500 mg | INTRAMUSCULAR | Status: DC | PRN

## 2018-02-14 MED ORDER — METOCLOPRAMIDE HCL 5 MG/ML IJ SOLN
INTRAMUSCULAR | Status: DC | PRN
  Administered 2018-02-14: 10 mg via INTRAVENOUS

## 2018-02-14 MED ORDER — ACETAMINOPHEN 160 MG/5ML PO SOLN: 975.0000 mg | Freq: Once | ORAL | Status: DC

## 2018-02-14 MED ORDER — OXYCODONE HCL 5 MG/5ML PO SOLN: 5.0000 mg | Freq: Once | ORAL | Status: DC | PRN

## 2018-02-14 MED ORDER — SUGAMMADEX SODIUM 200 MG/2ML IV SOLN
INTRAVENOUS | Status: AC
  Filled 2018-02-14: qty 2

## 2018-02-14 MED ORDER — MIDAZOLAM HCL 2 MG/2ML IJ SOLN
INTRAMUSCULAR | Status: DC | PRN
  Administered 2018-02-14: 2 mg via INTRAVENOUS

## 2018-02-14 SURGICAL SUPPLY — 41 items
ADH SKN CLS APL DERMABOND .7 (GAUZE/BANDAGES/DRESSINGS)
APL SKNCLS STERI-STRIP NONHPOA (GAUZE/BANDAGES/DRESSINGS)
APL SRG 38 LTWT LNG FL B (MISCELLANEOUS)
APPLICATOR ARISTA FLEXITIP XL (MISCELLANEOUS) IMPLANT
BARRIER ADHS 3X4 INTERCEED (GAUZE/BANDAGES/DRESSINGS) ×5 IMPLANT
BENZOIN TINCTURE PRP APPL 2/3 (GAUZE/BANDAGES/DRESSINGS) IMPLANT
BRR ADH 4X3 ABS CNTRL BYND (GAUZE/BANDAGES/DRESSINGS) ×4
CABLE HIGH FREQUENCY MONO STRZ (ELECTRODE) IMPLANT
CANISTER SUCT 3000ML PPV (MISCELLANEOUS) ×5 IMPLANT
DERMABOND ADVANCED (GAUZE/BANDAGES/DRESSINGS)
DERMABOND ADVANCED .7 DNX12 (GAUZE/BANDAGES/DRESSINGS) IMPLANT
DISSECTOR BLUNT TIP ENDO 5MM (MISCELLANEOUS) IMPLANT
DRSG OPSITE POSTOP 3X4 (GAUZE/BANDAGES/DRESSINGS) ×5 IMPLANT
DURAPREP 26ML APPLICATOR (WOUND CARE) ×5 IMPLANT
GLOVE BIOGEL PI IND STRL 7.0 (GLOVE) ×12 IMPLANT
GLOVE BIOGEL PI INDICATOR 7.0 (GLOVE) ×3
GLOVE ECLIPSE 6.5 STRL STRAW (GLOVE) ×10 IMPLANT
GOWN STRL REUS W/TWL LRG LVL3 (GOWN DISPOSABLE) ×20 IMPLANT
HEMOSTAT ARISTA ABSORB 3G PWDR (MISCELLANEOUS) IMPLANT
LIGASURE VESSEL 5MM BLUNT TIP (ELECTROSURGICAL) ×5 IMPLANT
NEEDLE INSUFFLATION 120MM (ENDOMECHANICALS) ×5 IMPLANT
NS IRRIG 1000ML POUR BTL (IV SOLUTION) ×5 IMPLANT
PACK LAPAROSCOPY BASIN (CUSTOM PROCEDURE TRAY) ×5 IMPLANT
PACK TRENDGUARD 450 HYBRID PRO (MISCELLANEOUS) ×4 IMPLANT
PACK TRENDGUARD 600 HYBRD PROC (MISCELLANEOUS) IMPLANT
POUCH LAPAROSCOPIC INSTRUMENT (MISCELLANEOUS) ×5 IMPLANT
PROTECTOR NERVE ULNAR (MISCELLANEOUS) ×10 IMPLANT
SCISSORS LAP 5X35 DISP (ENDOMECHANICALS) IMPLANT
SET CYSTO W/LG BORE CLAMP LF (SET/KITS/TRAYS/PACK) IMPLANT
SET IRRIG TUBING LAPAROSCOPIC (IRRIGATION / IRRIGATOR) ×5 IMPLANT
SLEEVE XCEL OPT CAN 5 100 (ENDOMECHANICALS) ×5 IMPLANT
SUT VICRYL 4-0 PS2 18IN ABS (SUTURE) ×5 IMPLANT
SYSTEM CARTER THOMASON II (TROCAR) ×5 IMPLANT
TOWEL OR 17X24 6PK STRL BLUE (TOWEL DISPOSABLE) ×10 IMPLANT
TRAY FOLEY CATH SILVER 14FR (SET/KITS/TRAYS/PACK) ×5 IMPLANT
TRENDGUARD 450 HYBRID PRO PACK (MISCELLANEOUS) ×5
TRENDGUARD 600 HYBRID PROC PK (MISCELLANEOUS)
TROCAR ADV FIXATION 5X100MM (TROCAR) ×5 IMPLANT
TROCAR XCEL NON-BLD 11X100MML (ENDOMECHANICALS) ×5 IMPLANT
TROCAR XCEL NON-BLD 5MMX100MML (ENDOMECHANICALS) ×5 IMPLANT
WARMER LAPAROSCOPE (MISCELLANEOUS) ×5 IMPLANT

## 2018-02-14 NOTE — Anesthesia Procedure Notes (Signed)
Procedure Name: Intubation Date/Time: 02/14/2018 10:07 AM Performed by: Graciela HusbandsFussell, Suhan Paci O, CRNA Pre-anesthesia Checklist: Patient identified, Emergency Drugs available, Suction available, Patient being monitored and Timeout performed Patient Re-evaluated:Patient Re-evaluated prior to induction Oxygen Delivery Method: Circle system utilized Preoxygenation: Pre-oxygenation with 100% oxygen Induction Type: IV induction Ventilation: Mask ventilation without difficulty Laryngoscope Size: Miller and 2 Grade View: Grade II Tube type: Oral Tube size: 7.0 mm Number of attempts: 1 Airway Equipment and Method: Stylet Placement Confirmation: ETT inserted through vocal cords under direct vision,  positive ETCO2,  CO2 detector and breath sounds checked- equal and bilateral Secured at: 22 cm Tube secured with: Tape Dental Injury: Teeth and Oropharynx as per pre-operative assessment

## 2018-02-14 NOTE — Anesthesia Preprocedure Evaluation (Signed)
Anesthesia Evaluation  Patient identified by MRN, date of birth, ID band Patient awake    Reviewed: Allergy & Precautions, NPO status , Patient's Chart, lab work & pertinent test results  Airway Mallampati: II  TM Distance: >3 FB Neck ROM: Full    Dental no notable dental hx.    Pulmonary neg pulmonary ROS, former smoker,    Pulmonary exam normal breath sounds clear to auscultation       Cardiovascular hypertension, Pt. on medications negative cardio ROS Normal cardiovascular exam Rhythm:Regular Rate:Normal     Neuro/Psych negative neurological ROS  negative psych ROS   GI/Hepatic negative GI ROS, Neg liver ROS,   Endo/Other  negative endocrine ROS  Renal/GU negative Renal ROS  negative genitourinary   Musculoskeletal negative musculoskeletal ROS (+)   Abdominal   Peds negative pediatric ROS (+)  Hematology negative hematology ROS (+)   Anesthesia Other Findings   Reproductive/Obstetrics negative OB ROS                             Anesthesia Physical Anesthesia Plan  ASA: II  Anesthesia Plan: General   Post-op Pain Management:    Induction: Intravenous  PONV Risk Score and Plan: 3 and Ondansetron, Dexamethasone and Midazolam  Airway Management Planned: Oral ETT  Additional Equipment:   Intra-op Plan:   Post-operative Plan: Extubation in OR  Informed Consent: I have reviewed the patients History and Physical, chart, labs and discussed the procedure including the risks, benefits and alternatives for the proposed anesthesia with the patient or authorized representative who has indicated his/her understanding and acceptance.   Dental advisory given  Plan Discussed with: CRNA  Anesthesia Plan Comments:         Anesthesia Quick Evaluation

## 2018-02-14 NOTE — Op Note (Signed)
Preoperative Diagnosis: Left adnexal mass, pelvic pain  Postoperative Diagnosis: Enlarged, dilated, torsed left fallopian tube, pelvic adhesions.    Procedure:  Laparoscopy, lysis of adhesions, bilateral salpingectomies  Surgeon: Dr Gertie Exon  Assistant: Dr Leda Quail  Anesthesia: General  EBL: 10 cc  Fluids: 1200 cc LR  Urine output: 250 cc  Complications: none  Indications for surgery: The patient is a 47 year old female with a prior history of TAH, who presented with severe LLQ abdominal, pelvic pain. Work up included a CT scan that showed a multicystic left adnexal mass. Ultrasound showed a mildly compolicated cyst in the left ovary that measured 7.5 x 4.2 x 4.0 cm, no signs of torsion. She had a normal CA 125, normal CBC and a postmenopausal FSH.  The patient is aware of the risks and complications involved with the surgery and consent was obtained prior to the procedure.  Findings: EUA: left adnexal mass, not mobile, absent uterus. Laparoscopy: bowel adhered to the left adnexa, enlarged, dilated left fallopian tube, torsed multiple times. It had filmy adhesions to the pelvic side wall. Normal ovaries and right fallopian tube.   Procedure: The patient was taken to the operating room with an IV in placed. She was placed in the dorsal lithotomy position. General anesthesia was administered. She was prepped and draped in the usual sterile fashion for an abdominal, vaginal surgery. A sponge stick was placed in the vagina. A foley catheter was placed.   The umbilicus was everted, injected with 0.25% marcaine and incised with a # 11 blade. 2 towel clips were used to elevated the umbilicus and a veress needle was placed into the abdominal cavity. The abdominal cavity was insufflated with CO2, with normal intraabdominal pressures. After adequate pneumo-insufflation the veress needle was removed and the 5 mm laparoscope was placed into the abdominal cavity using the opti-view trocar.  The patient was placed in trendelenburg and the abdominal pelvic cavity was inspected. 2 more trocars were placed. One in either lower quadrant approximately 3 cm medial and superior to the anterior superior iliac spine. These areas were injected with 0.25% marcaine, incised with a #11 blades. A 5 mm trocar was inserted in the left lower quadrant and an 11 mm trocar in the right lower quadrant. Both were inserted with direct visualization with the laparoscope. The abdominal pelvic cavity was again inspected.  The adhesions of the bowel to the left adnexa and the adnexa to the side wall were taken down bluntly. The left tube was untwisted. The tube was then separated from the normal ovary with the ligasure device. The right fallopian tube was separated from the ovary with the ligasure device and removed through the #11 trocar. The dilated tube was placed in a endocatch bag and removed through the #11 trocar site, it took approximately 30 minutes to get the dilated tube out with the endocatch bag.    The abdominal pelvic cavity was irrigated and suctioned dry. Pressure was released and hemostasis remained excellent. Interceed was placed on the left pelvic side wall.   The 11 mm midline trocar was removed and the [employer] device was used to place a stich of 0-Vicryl through the fascia. The abdominal cavity was desufflated and the trocars were removed. The skin was closed with subcuticular stiches of 4-0 vicryl and dermabond was placed over the incisions.  The foley catheter and sponge stick were removed.   The patient's abdomen and perineum were cleansed and she was taken out of the dorsal  lithotomy position. Upon awakening she was extubated and taken to the recovery room in stable condition. The sponge and instrument counts were correct.   With the adhesions, enlarged tube the procedure took twice as long as normal at over one hour.

## 2018-02-14 NOTE — Discharge Instructions (Signed)

## 2018-02-14 NOTE — Anesthesia Postprocedure Evaluation (Signed)
Anesthesia Post Note  Patient: Candice HasteStacey King  Procedure(s) Performed: LAPAROSCOPIC SALPINGECTOMY (Bilateral Abdomen) LAPAROSCOPIC LYSIS OF ADHESIONS (N/A Abdomen)     Patient location during evaluation: PACU Anesthesia Type: General Level of consciousness: awake and alert Pain management: pain level controlled Vital Signs Assessment: post-procedure vital signs reviewed and stable Respiratory status: spontaneous breathing, nonlabored ventilation and respiratory function stable Cardiovascular status: blood pressure returned to baseline and stable Postop Assessment: no apparent nausea or vomiting Anesthetic complications: no    Last Vitals:  Vitals:   02/14/18 1315 02/14/18 1325  BP: 109/61 (!) 113/58  Pulse: 75 82  Resp: 15 16  Temp:    SpO2: 94% 96%    Last Pain:  Vitals:   02/14/18 1325  TempSrc:   PainSc: 3    Pain Goal: Patients Stated Pain Goal: 3 (02/14/18 1325)               [name]

## 2018-02-14 NOTE — Transfer of Care (Signed)
Immediate Anesthesia Transfer of Care Note  Patient: Candice HasteStacey Eddings  Procedure(s) Performed: LAPAROSCOPIC SALPINGECTOMY (Bilateral Abdomen) LAPAROSCOPIC LYSIS OF ADHESIONS (N/A Abdomen)  Patient Location: PACU  Anesthesia Type:General  Level of Consciousness: awake, alert  and oriented  Airway & Oxygen Therapy: Patient Spontanous Breathing and Patient connected to nasal cannula oxygen  Post-op Assessment: Report given to RN and Post -op Vital signs reviewed and stable  Post vital signs: Reviewed and stable  Last Vitals:  Vitals Value Taken Time  BP 120/63 02/14/2018 11:47 AM  Temp    Pulse 83 02/14/2018 11:48 AM  Resp 15 02/14/2018 11:48 AM  SpO2 94 % 02/14/2018 11:48 AM  Vitals shown include unvalidated device data.  Last Pain:  Vitals:   02/14/18 0840  TempSrc: Oral  PainSc: 3       Patients Stated Pain Goal: 5 (02/14/18 0840)  Complications: No apparent anesthesia complications

## 2018-02-14 NOTE — Telephone Encounter (Signed)
Spoke with the patient, discussed her surgery from today. Answered her questions. She is feeling okay.

## 2018-02-14 NOTE — Interval H&P Note (Signed)
History and Physical Interval Note:  02/14/2018 9:19 AM  Candice HasteStacey Eddings  has presented today for surgery, with the diagnosis of adnexal mass, pelvic pain  The various methods of treatment have been discussed with the patient and family. After consideration of risks, benefits and other options for treatment, the patient has consented to  Procedure(s) with comments: LAPAROSCOPIC SALPINGO OOPHORECTOMY (Left) LAPAROSCOPIC UNILATERAL SALPINGECTOMY (Right) CYSTOSCOPY (N/A) - possible as a surgical intervention .  The patient's history has been reviewed, patient examined, no change in status, stable for surgery.  I have reviewed the patient's chart and labs.  Questions were answered to the patient's satisfaction.   Spoke with the patient counseled about the risks of surgery, including but not limited to: infection, bleeding, damage to bowel/bladder/blood vessels/ureters, need for further surgery.  Pre-operative labs with mildly elevated glucose of 107 and ALT of 61. Will further evaluate post-op  Romualdo BolkJill Evelyn Jertson

## 2018-02-15 ENCOUNTER — Encounter (HOSPITAL_COMMUNITY): Payer: Self-pay | Admitting: Obstetrics and Gynecology

## 2018-02-16 ENCOUNTER — Telehealth: Payer: Self-pay | Admitting: *Deleted

## 2018-02-16 ENCOUNTER — Other Ambulatory Visit: Payer: Self-pay | Admitting: Obstetrics and Gynecology

## 2018-02-16 MED ORDER — PROMETHAZINE HCL 12.5 MG PO TABS: ORAL_TABLET | ORAL | 0 refills | Status: DC

## 2018-02-16 MED ORDER — TRAMADOL HCL 50 MG PO TABS: 50.0000 mg | ORAL_TABLET | Freq: Four times a day (QID) | ORAL | 0 refills | Status: DC | PRN

## 2018-02-16 NOTE — Telephone Encounter (Signed)
Notes recorded by Leda MinHamm, Okema Rollinson N, RN on 02/16/2018 at 3:59 PM EDT Spoke with patient, advised as seen below per Dr. Reyne DumasJerston. Patient is s/p laparoscopic salpingectomy on 3/26. Reports lower abdominal pain 7/10, describes as "tearing sensation". Has been taking motrin 800 mg q8hrs, unable to tolerate percocet. Patient states she has no appetite, dry heaves when taking percocet, asking for alternative. Denies bleeding, fever/chills. Advised will review with Dr. Oscar LaJertson and return call. See telephone encounter dated 02/16/18.   Dr. Oscar LaJertson -please review and advise?

## 2018-02-16 NOTE — Telephone Encounter (Signed)
She tried to take the percocet for pain and started dry heaving, which really hurt. She has zofran, doesn't like it or tolerate it. No emesis prior to taking the percocet. The ibuprofen isn't enough for her pain.  She has been drinking water and ginger ale, voiding fine, no BM yet. Decreased appetitive.  Will call in phenergan and tramadol.  If her pain isn't controlled, if she can't tolerate po, or if she is just concerned she needs to call and be evaluated. [facility]  On questioning she states her pain now is still not as bad as it was last week.   Please call and check on the patient in the am.

## 2018-02-17 NOTE — Telephone Encounter (Signed)
Spoke with patient. Reports pain 5.5/10 this morning, took first dose of tramadol 2 minutes ago. Reports no nausea, has not needed phenergan yet. Has not eaten anything this morning. Recommended patient eat something such as a couple of  crackers with pain medication to reduce chances of nausea.   Advised patient I will update covering provider, if pain not improved with tramadol or unable to tolerate, return call to office for OV, [facility] as previously discussed with Dr. Oscar LaJertson.   Patient verbalizes understanding.   Routing to provider for final review. Patient is agreeable to disposition. Will close encounter.   Cc: Dr. Oscar LaJertson

## 2018-02-20 NOTE — Telephone Encounter (Signed)
Please call and check on the patient again

## 2018-02-21 ENCOUNTER — Telehealth: Payer: Self-pay | Admitting: *Deleted

## 2018-02-21 NOTE — Telephone Encounter (Signed)
Spoke with patient, advised as seen below per Dr. Jertson. Patient verbalizes understanding and is agreeable. Will close encounter.  

## 2018-02-21 NOTE — Telephone Encounter (Signed)
Spoke with patient. Called for update. States she feels better, pain is intermittent, 4/10. States her pain is from "healing internally", describes an intermittent, sharp, stabbing, pulling sensation. Has taken motrin x1 and tramadol 2 or 3 times,  "would rather bear it and not take medication constantly".  Appetite has improved some, eats when feels "queasy", symptoms improve after eating.   Has had 2 small bowel movements, abdomen soft. No bleeding or d/c.   Scheduled for post op f/u 4/9.  Advised patient will review with Dr. Oscar LaJertson and return call, patient is agreeable.    Dr. Oscar LaJertson -please review and advise?

## 2018-02-21 NOTE — Telephone Encounter (Signed)
As long as she is continuing to improve, I would not do anything different.

## 2018-02-21 NOTE — Telephone Encounter (Signed)
See telephone encounter dated 02/21/18.

## 2018-02-23 NOTE — Telephone Encounter (Signed)
Surgery completed on 02/14/18. Ok to close encounter

## 2018-02-28 ENCOUNTER — Encounter: Payer: Self-pay | Admitting: Obstetrics and Gynecology

## 2018-02-28 ENCOUNTER — Ambulatory Visit (INDEPENDENT_AMBULATORY_CARE_PROVIDER_SITE_OTHER): Payer: BLUE CROSS/BLUE SHIELD | Admitting: Obstetrics and Gynecology

## 2018-02-28 ENCOUNTER — Other Ambulatory Visit: Payer: Self-pay

## 2018-02-28 VITALS — BP 122/80 | HR 72 | Resp 12 | Wt 174.0 lb

## 2018-02-28 DIAGNOSIS — Z9889 Other specified postprocedural states: Secondary | ICD-10-CM

## 2018-02-28 DIAGNOSIS — R7989 Other specified abnormal findings of blood chemistry: Secondary | ICD-10-CM

## 2018-02-28 DIAGNOSIS — K5900 Constipation, unspecified: Secondary | ICD-10-CM

## 2018-02-28 DIAGNOSIS — R945 Abnormal results of liver function studies: Secondary | ICD-10-CM

## 2018-02-28 NOTE — Progress Notes (Signed)
GYNECOLOGY  VISIT   HPI: 47 y.o.   Married  [place]  female   [phone] with No LMP recorded. Patient has had a hysterectomy.   here for follow up LAPAROSCOPIC SALPINGECTOMY. She had laparoscopic BS for a torsed left tube. She has been very constipated since her surgery, only 2 hard BM's, never felt empty. She typically has 1-2 BM's a day. She is having intermittent pain and some bloating.      GYNECOLOGIC HISTORY: No LMP recorded. Patient has had a hysterectomy. Contraception:hysterectomy  Menopausal hormone therapy: none         OB History    Gravida  5   Para  5   Term  3   Preterm  2   AB      Living  5     SAB      TAB      Ectopic      Multiple      Live Births  5              There are no active problems to display for this patient.   Past Medical History:  Diagnosis Date  . Hyperlipidemia   . Hypertension     Past Surgical History:  Procedure Laterality Date  . ABDOMINAL HYSTERECTOMY    . ABDOMINAL SURGERY    . CESAREAN SECTION    . LAPAROSCOPIC LYSIS OF ADHESIONS N/A 02/14/2018   Procedure: LAPAROSCOPIC LYSIS OF ADHESIONS;  Surgeon: Romualdo BolkJertson, Amad Mau Evelyn, MD;  Location: [facility];  Service: Gynecology;  Laterality: N/A;  . LAPAROSCOPIC UNILATERAL SALPINGECTOMY Bilateral 02/14/2018   Procedure: LAPAROSCOPIC SALPINGECTOMY;  Surgeon: Romualdo BolkJertson, Elmire Amrein Evelyn, MD;  Location: [facility];  Service: Gynecology;  Laterality: Bilateral;  . TUBAL LIGATION      Current Outpatient Medications  Medication Sig Dispense Refill  . atorvastatin (LIPITOR) 10 MG tablet Take 10 mg by mouth daily.     . Biotin w/ Vitamins C & E (HAIR SKIN & NAILS GUMMIES PO) Take 2 each by mouth daily.    . Cholecalciferol (VITAMIN D3 PO) Take 1 capsule by mouth daily.    Marland Kitchen. ibuprofen (ADVIL,MOTRIN) 800 MG tablet Take 800 mg by mouth every 8 (eight) hours as needed for headache or moderate pain.    Marland Kitchen. lisinopril (PRINIVIL,ZESTRIL) 10 MG tablet Take 10 mg by mouth daily.  5  .  promethazine (PHENERGAN) 12.5 MG tablet 1-2 tablets every 4-6 hours as needed for nausea 20 tablet 0  . traMADol (ULTRAM) 50 MG tablet Take 1 tablet (50 mg total) by mouth every 6 (six) hours as needed. 20 tablet 0   No current facility-administered medications for this visit.      ALLERGIES: Patient has no known allergies.  Family History  Problem Relation Age of Onset  . Thyroid disease Mother   . Heart disease Maternal Grandmother   . Diabetes Maternal Grandmother   . Diabetes Paternal Grandmother     Social History   Socioeconomic History  . Marital status: Married    Spouse name: Not on file  . Number of children: Not on file  . Years of education: Not on file  . Highest education level: Not on file  Occupational History  . Not on file  Social Needs  . Financial resource strain: Not on file  . Food insecurity:    Worry: Not on file    Inability: Not on file  . Transportation needs:    Medical: Not on file  Non-medical: Not on file  Tobacco Use  . Smoking status: Former Smoker    Packs/day: 0.50    Years: 20.00    Pack years: 10.00    Types: Cigarettes  . Smokeless tobacco: Never Used  Substance and Sexual Activity  . Alcohol use: Yes    Alcohol/week: 1.2 oz    Types: 2 Standard drinks or equivalent per week  . Drug use: No  . Sexual activity: Yes    Partners: Male    Birth control/protection: Surgical    Comment: hysterectomy   Lifestyle  . Physical activity:    Days per week: Not on file    Minutes per session: Not on file  . Stress: Not on file  Relationships  . Social connections:    Talks on phone: Not on file    Gets together: Not on file    Attends religious service: Not on file    Active member of club or organization: Not on file    Attends meetings of clubs or organizations: Not on file    Relationship status: Not on file  . Intimate partner violence:    Fear of current or ex partner: Not on file    Emotionally abused: Not on file     Physically abused: Not on file    Forced sexual activity: Not on file  Other Topics Concern  . Not on file  Social History Narrative  . Not on file    Review of Systems  Constitutional: Negative.   HENT: Negative.   Eyes: Negative.   Respiratory: Negative.   Gastrointestinal: Positive for constipation and nausea.       Bloating   Genitourinary: Negative.   Musculoskeletal: Negative.   Skin: Negative.   Neurological: Negative.   Endo/Heme/Allergies: Negative.   Psychiatric/Behavioral: Negative.     PHYSICAL EXAMINATION:    BP 122/80 (BP Location: Right Arm, Patient Position: Sitting, Cuff Size: Normal)   Pulse 72   Resp 12   Wt 174 lb (78.9 kg)   BMI 28.96 kg/m     General appearance: alert, cooperative and appears stated age Abdomen: soft, non-tender; mildly distended, no masses,  no organomegaly. Incisions are healing well.    ASSESSMENT 2 weeks s/p laparoscopic BS for tubal torsion Constipation since surgery Mildly elevated ALT 2 weeks ago    PLAN Discussed treatment of constipation, call if not improving Avoid ETOH, tylenol, NSAID's, recheck LFTs in one month F/U for annual exam in 6 months   An After Visit Summary was printed and given to the patient.

## 2018-03-03 ENCOUNTER — Encounter: Payer: Self-pay | Admitting: Obstetrics and Gynecology

## 2018-03-03 ENCOUNTER — Telehealth: Payer: Self-pay | Admitting: *Deleted

## 2018-03-03 NOTE — Telephone Encounter (Signed)
My Chart message from patient:  Message   ----- Message from Mychart, Generic sent at 03/03/2018 11:26 AM EDT -----    I still have not had a bowel movement... is it safe for me to drink magnesium citrate?  Select Font Size      Wyatt HasteStacey Eddings  03/03/2018  Patient Email  MRN:  [id number]  Description: 47 year old female Provider: Romualdo BolkJertson, Jill Evelyn, MD [facility]

## 2018-03-03 NOTE — Telephone Encounter (Signed)
Pt called with complaints of constipation since surgery.  Bowel movements of hard pellets is occurring.  Having flatus.  Uncomfortable.  Off pain medication.  Has tried all home remidies discussed with Dr. Oscar LaJertson on Tuesday.  Requesting to drink magnesium citrate.    Divided dose of 2TBS (1oz) of magnesium citrate every 3 hours, not to exceed one bottle discussed.  ER precautions given.  Instructions given to call with update on Monday.

## 2018-03-06 NOTE — Telephone Encounter (Signed)
She can try senna. Start with one tablet every 12 hours until her BM improve. She can increase to 2 tablets 2 x a day as needed. She will get diarrhea if she takes too much.

## 2018-03-06 NOTE — Telephone Encounter (Signed)
Can you please call and check on the patient. See if she has had improvement in her constipation. She took magnesium citrate on Friday.

## 2018-03-06 NOTE — Telephone Encounter (Signed)
Spoke with patient and gave recommendations per Dr. Jertson. Patient voiced understanding -eh 

## 2018-03-06 NOTE — Telephone Encounter (Signed)
Spoke with patient and she was only able to have one  BM after drinking 5oz of the magnesium citrate on Friday. She said she could not finish it because of the cramping.  She had a small BM this morning which was "small pellets"  She is trying to take Probiotic and increased her fiber.

## 2018-03-15 ENCOUNTER — Other Ambulatory Visit: Payer: Self-pay | Admitting: Obstetrics and Gynecology

## 2018-03-15 ENCOUNTER — Telehealth: Payer: Self-pay | Admitting: Obstetrics and Gynecology

## 2018-03-15 MED ORDER — PROMETHAZINE HCL 12.5 MG PO TABS: ORAL_TABLET | ORAL | 0 refills | Status: DC

## 2018-03-15 NOTE — Telephone Encounter (Signed)
Medication refill request: phenergan  Last AEX: OV 02/07/18  Next AEX: 08/30/18 JJ Last MMG (if hormonal medication request): none Refill authorized: 02/16/18 #[id number]. Today please advise.

## 2018-03-15 NOTE — Telephone Encounter (Signed)
Left message for patient to call regarding refill -eh

## 2018-03-15 NOTE — Telephone Encounter (Signed)
I've sent the script for phenergan, but she shouldn't still be needing this. Her laparoscopic surgery was a month ago. Please call and check on her. At her last visit she was c/o constipation. Please also check if that has improved.

## 2018-03-15 NOTE — Telephone Encounter (Signed)
Spoke with patient and she is still having nausea after she eats. She is still having problems with having normal BMs. She is still taking the Senna and a probiotic and eating fiber bars. Her last BM was Monday am. Advised patient she needs follow up visit. Patient scheduled for 03-17-18 with Dr. Oscar LaJertson -eh       I've sent the script for phenergan, but she shouldn't still be needing this. Her laparoscopic surgery was a month ago. Please call and check on her. At her last visit she was c/o constipation. Please also check if that has improved.    Documentation

## 2018-03-15 NOTE — Telephone Encounter (Signed)
[place] No open telephone note? See recent refill request.

## 2018-03-17 ENCOUNTER — Ambulatory Visit: Payer: BLUE CROSS/BLUE SHIELD | Admitting: Obstetrics and Gynecology

## 2018-03-17 ENCOUNTER — Encounter: Payer: Self-pay | Admitting: Obstetrics and Gynecology

## 2018-03-17 ENCOUNTER — Other Ambulatory Visit: Payer: Self-pay

## 2018-03-17 VITALS — BP 128/84 | HR 84 | Resp 16 | Wt 174.0 lb

## 2018-03-17 DIAGNOSIS — R110 Nausea: Secondary | ICD-10-CM | POA: Diagnosis not present

## 2018-03-17 DIAGNOSIS — K5900 Constipation, unspecified: Secondary | ICD-10-CM

## 2018-03-17 MED ORDER — RANITIDINE HCL 150 MG PO TABS: 150.0000 mg | ORAL_TABLET | Freq: Two times a day (BID) | ORAL | 0 refills | Status: DC

## 2018-03-17 NOTE — Patient Instructions (Signed)
Senna start taking 1 tablet 2 x a day. If you don't have normal BM within 24-48, increase to 2 tablets in the am and one in the pm. Can go to a max dose of 2 tablets 2 x a day

## 2018-03-17 NOTE — Progress Notes (Signed)
GYNECOLOGY  VISIT   HPI: 47 y.o.   Married  [place]   [phone] with No LMP recorded. Patient has had a hysterectomy.   She is one month s/o laparoscopy with BSO for a torsed fallopian tube. She is here c/o continued constipation and nausea. She is currently taking one senna a day for the last week, she has changed her diet. Drinking a ton of water, eating fiber bars. She had a BM on Monday, didn't feel empty, next BM was this morning.   She continues to have nausea. Prior to her surgery she would occasionally had nausea, but now has episodes of nausea every day. Feels nausea in the am, worst at night, occasionally during the day. She has no pain like before surgery but is getting intermittent cramping.  No emesis.  GYNECOLOGIC HISTORY: No LMP recorded. Patient has had a hysterectomy. Contraception:hysterectomy  Menopausal hormone therapy: none         OB History    Gravida  5   Para  5   Term  3   Preterm  2   AB      Living  5     SAB      TAB      Ectopic      Multiple      Live Births  5              There are no active problems to display for this patient.   Past Medical History:  Diagnosis Date  . Hyperlipidemia   . Hypertension     Past Surgical History:  Procedure Laterality Date  . ABDOMINAL HYSTERECTOMY    . ABDOMINAL SURGERY    . CESAREAN SECTION    . LAPAROSCOPIC LYSIS OF ADHESIONS N/A 02/14/2018   Procedure: LAPAROSCOPIC LYSIS OF ADHESIONS;  Surgeon: Romualdo BolkJertson, Jill Evelyn, MD;  Location: [facility];  Service: Gynecology;  Laterality: N/A;  . LAPAROSCOPIC UNILATERAL SALPINGECTOMY Bilateral 02/14/2018   Procedure: LAPAROSCOPIC SALPINGECTOMY;  Surgeon: Romualdo BolkJertson, Jill Evelyn, MD;  Location: [facility];  Service: Gynecology;  Laterality: Bilateral;  . TUBAL LIGATION      Current Outpatient Medications  Medication Sig Dispense Refill  . atorvastatin (LIPITOR) 10 MG tablet Take 10 mg by mouth daily.     . Biotin w/ Vitamins C & E (HAIR SKIN  & NAILS GUMMIES PO) Take 2 each by mouth daily.    . Cholecalciferol (VITAMIN D3 PO) Take 1 capsule by mouth daily.    Marland Kitchen. ibuprofen (ADVIL,MOTRIN) 800 MG tablet Take 800 mg by mouth every 8 (eight) hours as needed for headache or moderate pain.    Marland Kitchen. lisinopril (PRINIVIL,ZESTRIL) 10 MG tablet Take 10 mg by mouth daily.  5  . promethazine (PHENERGAN) 12.5 MG tablet 1-2 tablets every 4-6 hours as needed for nausea 20 tablet 0  . traMADol (ULTRAM) 50 MG tablet Take 1 tablet (50 mg total) by mouth every 6 (six) hours as needed. 20 tablet 0   No current facility-administered medications for this visit.      ALLERGIES: Patient has no known allergies.  Family History  Problem Relation Age of Onset  . Thyroid disease Mother   . Heart disease Maternal Grandmother   . Diabetes Maternal Grandmother   . Diabetes Paternal Grandmother     Social History   Socioeconomic History  . Marital status: Married    Spouse name: Not on file  . Number of children: Not on file  . Years of education:  Not on file  . Highest education level: Not on file  Occupational History  . Not on file  Social Needs  . Financial resource strain: Not on file  . Food insecurity:    Worry: Not on file    Inability: Not on file  . Transportation needs:    Medical: Not on file    Non-medical: Not on file  Tobacco Use  . Smoking status: Former Smoker    Packs/day: 0.50    Years: 20.00    Pack years: 10.00    Types: Cigarettes  . Smokeless tobacco: Never Used  Substance and Sexual Activity  . Alcohol use: Yes    Alcohol/week: 1.2 oz    Types: 2 Standard drinks or equivalent per week  . Drug use: No  . Sexual activity: Yes    Partners: Male    Birth control/protection: Surgical    Comment: hysterectomy   Lifestyle  . Physical activity:    Days per week: Not on file    Minutes per session: Not on file  . Stress: Not on file  Relationships  . Social connections:    Talks on phone: Not on file    Gets  together: Not on file    Attends religious service: Not on file    Active member of club or organization: Not on file    Attends meetings of clubs or organizations: Not on file    Relationship status: Not on file  . Intimate partner violence:    Fear of current or ex partner: Not on file    Emotionally abused: Not on file    Physically abused: Not on file    Forced sexual activity: Not on file  Other Topics Concern  . Not on file  Social History Narrative  . Not on file    Review of Systems  Constitutional: Negative.   HENT: Negative.   Eyes: Negative.   Respiratory: Negative.   Cardiovascular: Negative.   Gastrointestinal: Positive for constipation and diarrhea.       Bloating   Musculoskeletal: Negative.   Skin: Negative.   Neurological: Negative.   Endo/Heme/Allergies: Negative.   Psychiatric/Behavioral: Negative.     PHYSICAL EXAMINATION:    BP 128/84 (BP Location: Right Arm, Patient Position: Sitting, Cuff Size: Normal)   Pulse 84   Resp 16   Wt 174 lb (78.9 kg)   BMI 28.96 kg/m     General appearance: alert, cooperative and appears stated age Abdomen: soft, non-tender; non distended, no masses,  no organomegaly   ASSESSMENT Constipation and nausea since surgery. Normal exam Prior h/o reflux   PLAN Increase senna to 1-2 tablets 1-2 x a day Trial of Zantac 150 BID If she isn't improving with refer to GI   An After Visit Summary was printed and given to the patient.

## 2018-03-30 ENCOUNTER — Other Ambulatory Visit: Payer: BLUE CROSS/BLUE SHIELD

## 2018-03-30 DIAGNOSIS — R945 Abnormal results of liver function studies: Principal | ICD-10-CM

## 2018-03-30 DIAGNOSIS — R7989 Other specified abnormal findings of blood chemistry: Secondary | ICD-10-CM

## 2018-03-31 LAB — HEPATIC FUNCTION PANEL
ALBUMIN: 4.2 g/dL (ref 3.5–5.5)
ALT: 24 IU/L (ref 0–32)
AST: 17 IU/L (ref 0–40)
Alkaline Phosphatase: 67 IU/L (ref 39–117)
BILIRUBIN TOTAL: 0.2 mg/dL (ref 0.0–1.2)
BILIRUBIN, DIRECT: 0.07 mg/dL (ref 0.00–0.40)
Total Protein: 7.2 g/dL (ref 6.0–8.5)

## 2018-08-28 NOTE — Progress Notes (Deleted)
47 y.o. [id number] Married Black or [place] American Not Hispanic or Latino female here for annual exam.      No LMP recorded. Patient has had a hysterectomy.          Sexually active: {yes no:314532}  The current method of family planning is {contraception:315051}.    Exercising: {yes no:314532}  {types:19826} Smoker:  {YES NO:22349}  Health Maintenance: Pap:  18 years ago History of abnormal Pap:  No MMG:  10/26/2017 WNL  Colonoscopy:  Never BMD:   Never TDaP:  10-17-15 Gardasil: N/A   reports that she has quit smoking. Her smoking use included cigarettes. She has a 10.00 pack-year smoking history. She has never used smokeless tobacco. She reports that she drinks about 2.0 standard drinks of alcohol per week. She reports that she does not use drugs.  Past Medical History:  Diagnosis Date  . Hyperlipidemia   . Hypertension     Past Surgical History:  Procedure Laterality Date  . ABDOMINAL HYSTERECTOMY    . ABDOMINAL SURGERY    . CESAREAN SECTION    . LAPAROSCOPIC LYSIS OF ADHESIONS N/A 02/14/2018   Procedure: LAPAROSCOPIC LYSIS OF ADHESIONS;  Surgeon: Romualdo Bolk, MD;  Location: [facility];  Service: Gynecology;  Laterality: N/A;  . LAPAROSCOPIC UNILATERAL SALPINGECTOMY Bilateral 02/14/2018   Procedure: LAPAROSCOPIC SALPINGECTOMY;  Surgeon: Romualdo Bolk, MD;  Location: [facility];  Service: Gynecology;  Laterality: Bilateral;  . TUBAL LIGATION      Current Outpatient Medications  Medication Sig Dispense Refill  . atorvastatin (LIPITOR) 10 MG tablet Take 10 mg by mouth daily.     . Biotin w/ Vitamins C & E (HAIR SKIN & NAILS GUMMIES PO) Take 2 each by mouth daily.    . Cholecalciferol (VITAMIN D3 PO) Take 1 capsule by mouth daily.    Marland Kitchen ibuprofen (ADVIL,MOTRIN) 800 MG tablet Take 800 mg by mouth every 8 (eight) hours as needed for headache or moderate pain.    Marland Kitchen lisinopril (PRINIVIL,ZESTRIL) 10 MG tablet Take 10 mg by mouth daily.  5  . promethazine (PHENERGAN) 12.5 MG  tablet 1-2 tablets every 4-6 hours as needed for nausea 20 tablet 0  . ranitidine (ZANTAC) 150 MG tablet Take 1 tablet (150 mg total) by mouth 2 (two) times daily. 60 tablet 0  . traMADol (ULTRAM) 50 MG tablet Take 1 tablet (50 mg total) by mouth every 6 (six) hours as needed. 20 tablet 0   No current facility-administered medications for this visit.     Family History  Problem Relation Age of Onset  . Thyroid disease Mother   . Heart disease Maternal Grandmother   . Diabetes Maternal Grandmother   . Diabetes Paternal Grandmother     Review of Systems  Exam:   There were no vitals taken for this visit.  Weight change: @WEIGHTCHANGE @ Height:      Ht Readings from Last 3 Encounters:  02/14/18 5\' 5"  (1.651 m)  02/07/18 5\' 6"  (1.676 m)  02/11/15 5\' 5"  (1.651 m)    General appearance: alert, cooperative and appears stated age Head: Normocephalic, without obvious abnormality, atraumatic Neck: no adenopathy, supple, symmetrical, trachea midline and thyroid {CHL AMB PHY EX THYROID NORM DEFAULT:[phone]::"normal to inspection and palpation"} Lungs: clear to auscultation bilaterally Cardiovascular: regular rate and rhythm Breasts: {Exam; breast:13139::"normal appearance, no masses or tenderness"} Abdomen: soft, non-tender; non distended,  no masses,  no organomegaly Extremities: extremities normal, atraumatic, no cyanosis or edema Skin: Skin color, texture, turgor normal. No rashes or  lesions Lymph nodes: Cervical, supraclavicular, and axillary nodes normal. No abnormal inguinal nodes palpated Neurologic: Grossly normal   Pelvic: External genitalia:  no lesions              Urethra:  normal appearing urethra with no masses, tenderness or lesions              Bartholins and Skenes: normal                 Vagina: normal appearing vagina with normal color and discharge, no lesions              Cervix: {CHL AMB PHY EX CERVIX NORM DEFAULT:[id number]::"no lesions"}                Bimanual Exam:  Uterus:  {CHL AMB PHY EX UTERUS NORM DEFAULT:[id number]::"normal size, contour, position, consistency, mobility, non-tender"}              Adnexa: {CHL AMB PHY EX ADNEXA NO MASS DEFAULT:[phone]::"no mass, fullness, tenderness"}               Rectovaginal: Confirms               Anus:  normal sphincter tone, no lesions  Chaperone was present for exam.  A:  Well Woman with normal exam  P:

## 2018-08-30 ENCOUNTER — Ambulatory Visit: Payer: BLUE CROSS/BLUE SHIELD | Admitting: Obstetrics and Gynecology

## 2019-09-12 IMAGING — US US ART/VEN ABD/PELV/SCROTUM DOPPLER LTD
1 series · 15 of 25 positions shown · non-contrast
Comparison: None

CLINICAL DATA: LEFT ovarian cyst, LEFT lower quadrant and adnexal
pain

EXAM:
TRANSABDOMINAL AND TRANSVAGINAL ULTRASOUND OF PELVIS
DOPPLER ULTRASOUND OF OVARIES
TECHNIQUE: Both transabdominal and transvaginal ultrasound examinations of the
pelvis were performed. Transabdominal technique was performed for
global imaging of the pelvis including uterus, ovaries, adnexal
regions, and pelvic cul-de-sac.
It was necessary to proceed with endovaginal exam following the
transabdominal exam to visualize the LEFT ovarian mass and the RIGHT
ovary. Color and duplex Doppler ultrasound was utilized to evaluate
blood flow to the ovaries.

[Series 1: us art/ven abd/pelv/scrotum doppler ltd · 62 acquisitions, 15 frames shown]
[im 1/62]
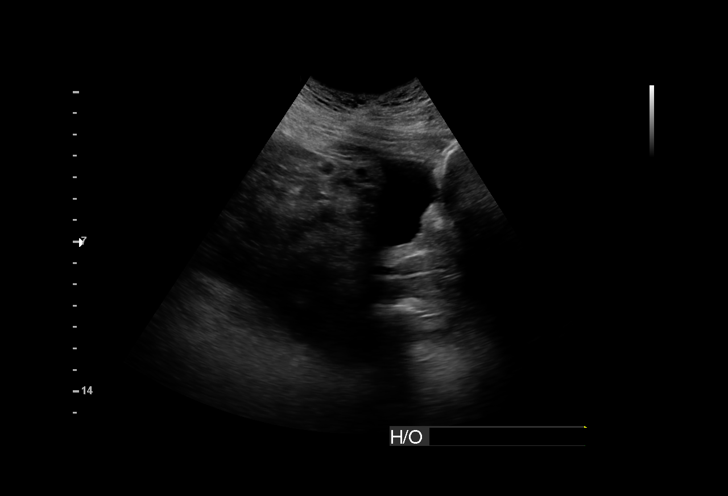
[im 6/62]
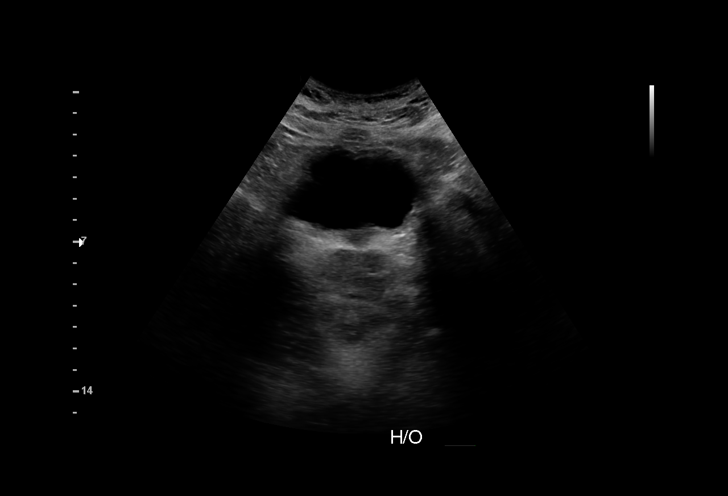
[im 11/62]
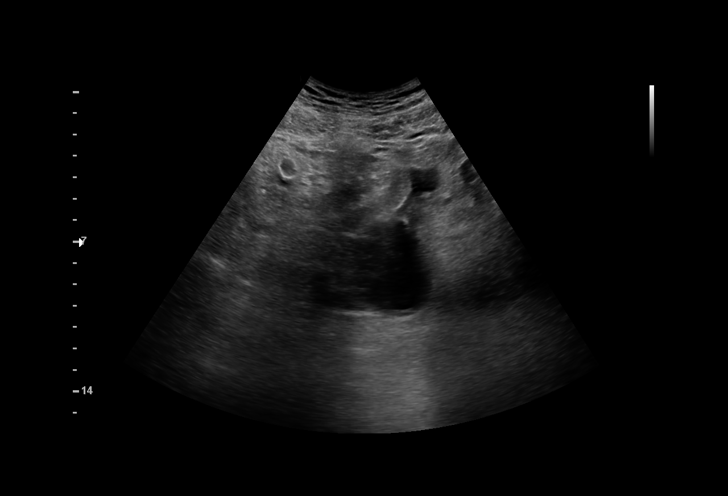
[im 13/62]
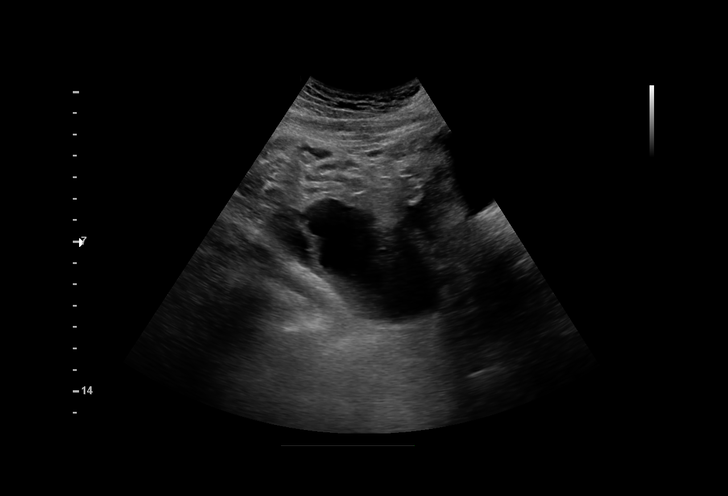
[im 18/62]
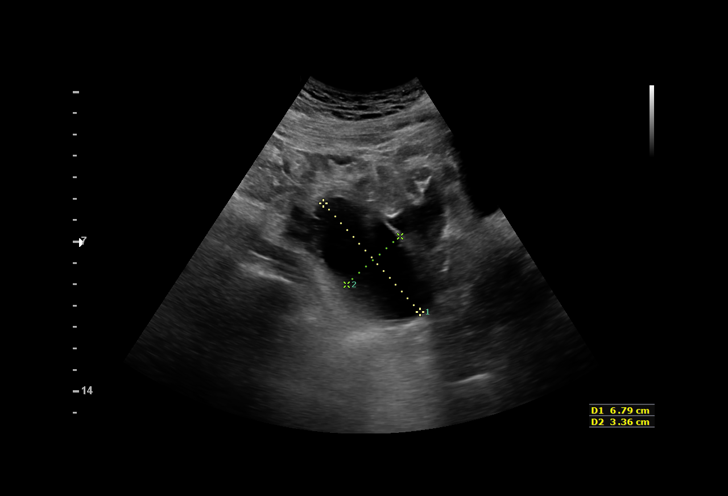
[im 23/62]
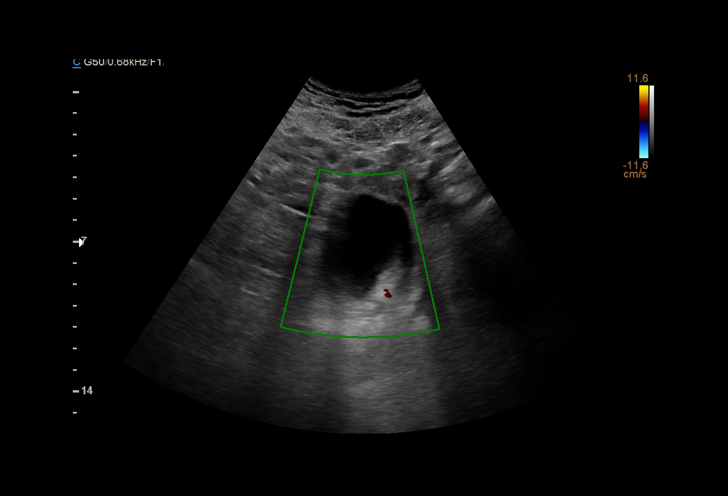
[im 26/62]
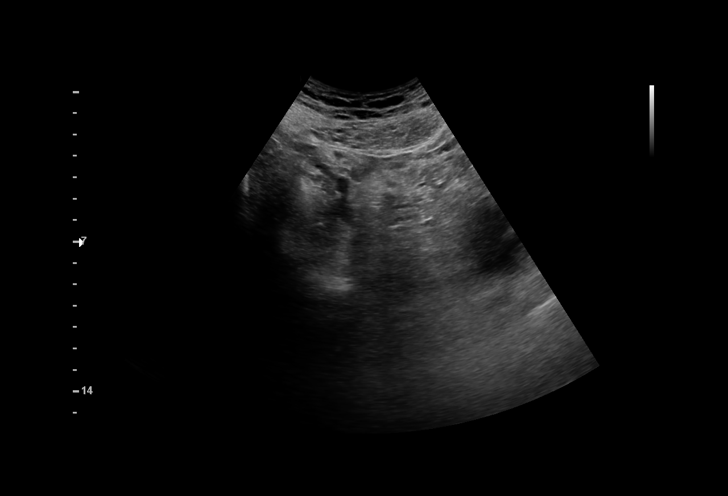
[im 31/62]
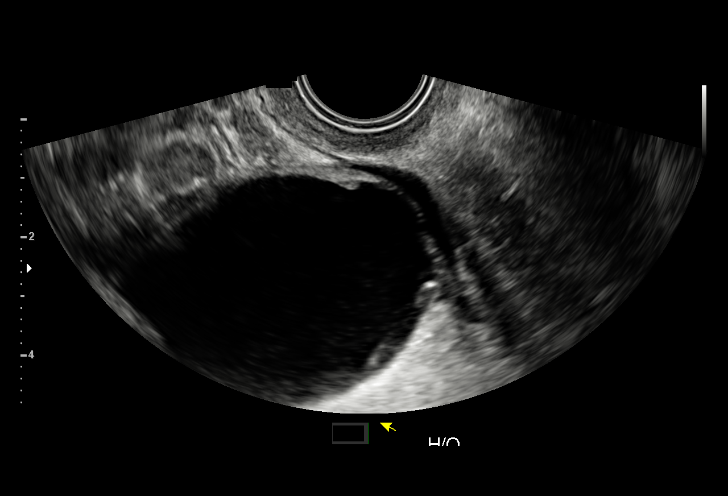
[im 36/62]
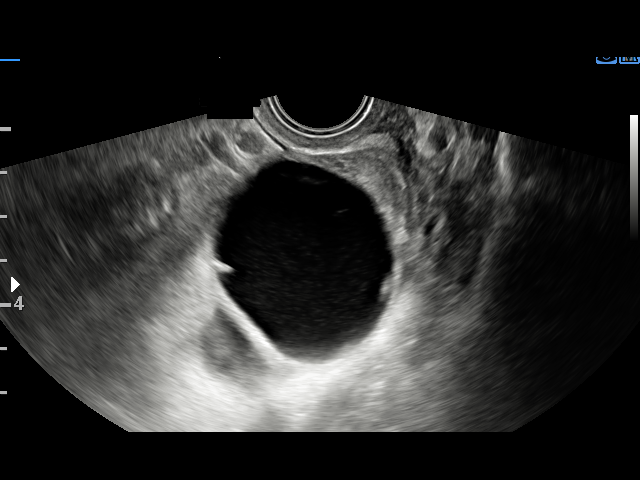
[im 39/62]
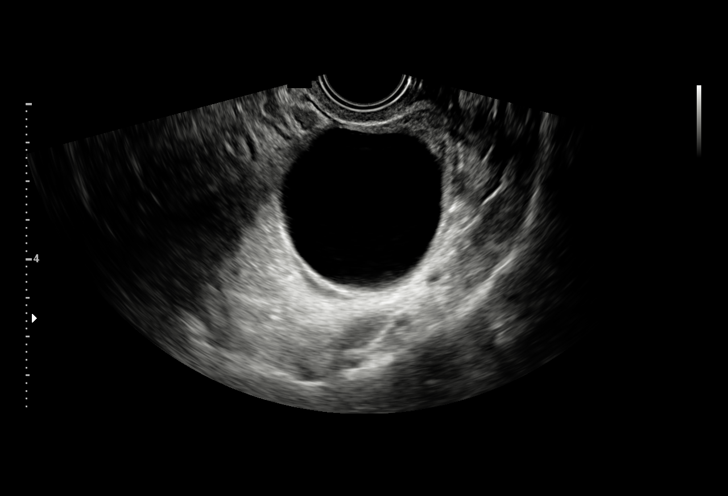
[im 44/62]
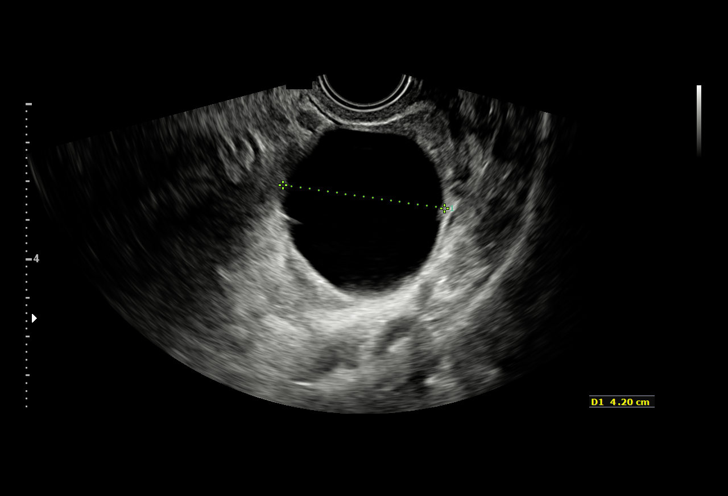
[im 49/62]
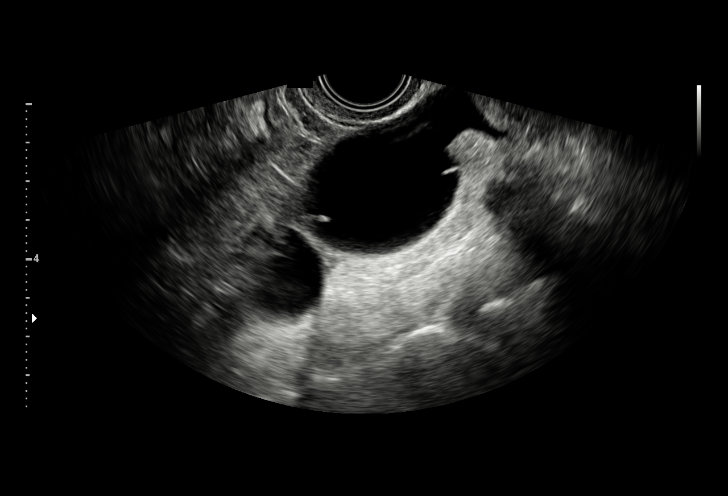
[im 51/62]
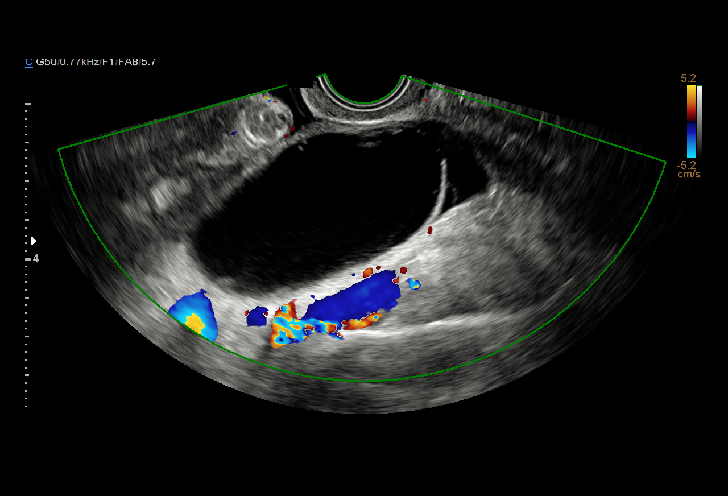
[im 56/62]
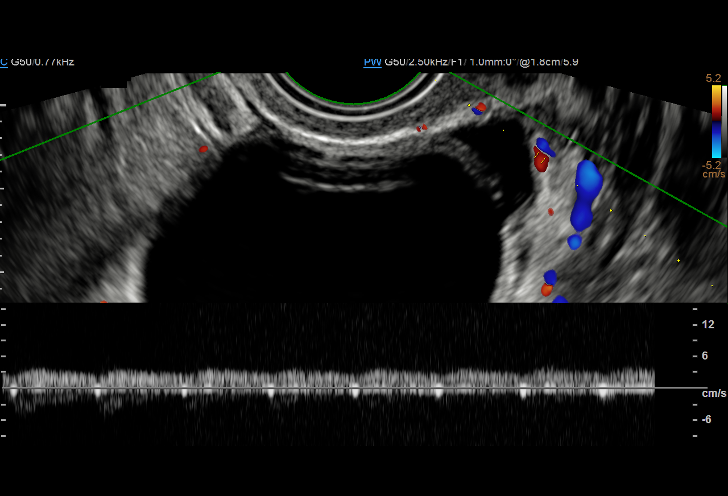
[im 62/62]
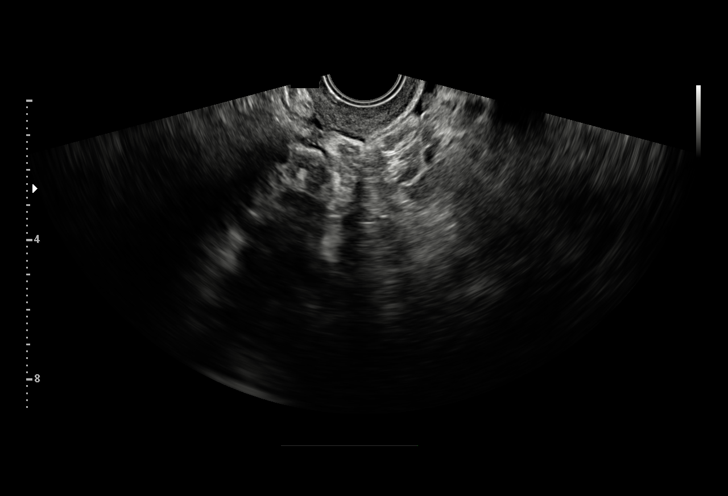

[15 of 25 positions shown; findings below may reference images not displayed]

FINDINGS: Uterus

Surgically absent

Endometrium

N/A

Right ovary

Not visualized on either transabdominal or endovaginal imaging
question obscured by bowel

Left ovary

Measurements: 8.3 x 4.2 x 4.5 cm. Large cyst 7.5 x 4.2 x 4.0 cm with
a few thin septations. An additional questionable thicker irregular
septation 3 mm thick versus intervening soft tissue between the
large cyst and additional smaller cysts within LEFT ovary
visualized. No definite mural nodularity within the dominant cystic
lesion. Blood flow present within LEFT ovary in the periphery of the
cystic lesion on color Doppler imaging.

Pulsed Doppler evaluation of both ovaries demonstrates normal
low-resistance arterial and venous waveforms within the LEFT ovary.
RIGHT ovary could not be localized..

Other findings

Small amount of LEFT paraovarian free pelvic fluid. No additional
adnexal masses.
IMPRESSION: Post hysterectomy.

Nonvisualization of RIGHT ovary.

Large mildly complicated cyst within LEFT ovary 7.5 x 4.2 x 4.0 cm
in size with thin septations as well as a questionable slightly
thicker irregular septation versus intervening soft tissue between
the cystic lesion and additional smaller cysts within the LEFT
ovary; based on size and appearance, consider surgical management.

No evidence of LEFT ovarian torsion.

## 2019-12-03 DIAGNOSIS — Z03818 Encounter for observation for suspected exposure to other biological agents ruled out: Secondary | ICD-10-CM | POA: Diagnosis not present

## 2020-01-30 DIAGNOSIS — Z23 Encounter for immunization: Secondary | ICD-10-CM | POA: Diagnosis not present

## 2020-02-20 DIAGNOSIS — Z23 Encounter for immunization: Secondary | ICD-10-CM | POA: Diagnosis not present

## 2022-04-22 DIAGNOSIS — R112 Nausea with vomiting, unspecified: Secondary | ICD-10-CM | POA: Insufficient documentation

## 2022-06-10 ENCOUNTER — Emergency Department (HOSPITAL_BASED_OUTPATIENT_CLINIC_OR_DEPARTMENT_OTHER)
Admission: EM | Admit: 2022-06-10 | Discharge: 2022-06-11 | Discharge status: Against Medical Advice | Payer: Managed Care, Other (non HMO) | Attending: Emergency Medicine | Admitting: Emergency Medicine

## 2022-06-10 ENCOUNTER — Encounter (HOSPITAL_BASED_OUTPATIENT_CLINIC_OR_DEPARTMENT_OTHER): Payer: Self-pay

## 2022-06-10 DIAGNOSIS — Z5321 Procedure and treatment not carried out due to patient leaving prior to being seen by health care provider: Secondary | ICD-10-CM | POA: Insufficient documentation

## 2022-06-10 DIAGNOSIS — R5383 Other fatigue: Secondary | ICD-10-CM | POA: Diagnosis not present

## 2022-06-10 DIAGNOSIS — R109 Unspecified abdominal pain: Secondary | ICD-10-CM | POA: Diagnosis present

## 2022-06-10 DIAGNOSIS — R110 Nausea: Secondary | ICD-10-CM | POA: Insufficient documentation

## 2022-06-10 DIAGNOSIS — K5909 Other constipation: Secondary | ICD-10-CM | POA: Insufficient documentation

## 2022-06-10 DIAGNOSIS — R112 Nausea with vomiting, unspecified: Secondary | ICD-10-CM | POA: Diagnosis not present

## 2022-06-10 DIAGNOSIS — R1013 Epigastric pain: Secondary | ICD-10-CM | POA: Diagnosis not present

## 2022-06-10 LAB — CBC WITH DIFFERENTIAL/PLATELET
Abs Immature Granulocytes: 0.01 10*3/uL (ref 0.00–0.07)
Basophils Absolute: 0 10*3/uL (ref 0.0–0.1)
Basophils Relative: 0 %
Eosinophils Absolute: 0.1 10*3/uL (ref 0.0–0.5)
Eosinophils Relative: 2 %
HCT: 41.3 % (ref 36.0–46.0)
Hemoglobin: 13.8 g/dL (ref 12.0–15.0)
Immature Granulocytes: 0 %
Lymphocytes Relative: 33 %
Lymphs Abs: 1.8 10*3/uL (ref 0.7–4.0)
MCH: 29.8 pg (ref 26.0–34.0)
MCHC: 33.4 g/dL (ref 30.0–36.0)
MCV: 89.2 fL (ref 80.0–100.0)
Monocytes Absolute: 0.5 10*3/uL (ref 0.1–1.0)
Monocytes Relative: 9 %
Neutro Abs: 3 10*3/uL (ref 1.7–7.7)
Neutrophils Relative %: 56 %
Platelets: 322 10*3/uL (ref 150–400)
RBC: 4.63 MIL/uL (ref 3.87–5.11)
RDW: 13.3 % (ref 11.5–15.5)
WBC: 5.4 10*3/uL (ref 4.0–10.5)
nRBC: 0 % (ref 0.0–0.2)

## 2022-06-10 LAB — URINALYSIS, ROUTINE W REFLEX MICROSCOPIC
Bilirubin Urine: NEGATIVE
Glucose, UA: NEGATIVE mg/dL
Hgb urine dipstick: NEGATIVE
Ketones, ur: NEGATIVE mg/dL
Leukocytes,Ua: NEGATIVE
Nitrite: NEGATIVE
Protein, ur: NEGATIVE mg/dL
Specific Gravity, Urine: 1.02 (ref 1.005–1.030)
pH: 5.5 (ref 5.0–8.0)

## 2022-06-10 LAB — COMPREHENSIVE METABOLIC PANEL WITH GFR
ALT: 19 U/L (ref 0–44)
AST: 19 U/L (ref 15–41)
Albumin: 4.8 g/dL (ref 3.5–5.0)
Alkaline Phosphatase: 69 U/L (ref 38–126)
Anion gap: 10 (ref 5–15)
BUN: 17 mg/dL (ref 6–20)
CO2: 23 mmol/L (ref 22–32)
Calcium: 10.1 mg/dL (ref 8.9–10.3)
Chloride: 100 mmol/L (ref 98–111)
Creatinine, Ser: 0.87 mg/dL (ref 0.44–1.00)
GFR, Estimated: >60 mL/min
Glucose, Bld: 99 mg/dL (ref 70–99)
Potassium: 3.8 mmol/L (ref 3.5–5.1)
Sodium: 133 mmol/L — ABNORMAL LOW (ref 135–145)
Total Bilirubin: 0.3 mg/dL (ref 0.3–1.2)
Total Protein: 8.7 g/dL — ABNORMAL HIGH (ref 6.5–8.1)

## 2022-06-10 LAB — LIPASE, BLOOD: Lipase: 49 U/L (ref 11–51)

## 2022-06-10 NOTE — ED Triage Notes (Signed)
Pt c/o intermittent centralized abd pain x 1 week. + nausea Saw PCP today and they referred her here

## 2022-06-10 NOTE — ED Notes (Signed)
  Patient called out for charge nurse asking for PIV to be removed.  Patient leaving before seeing doctor due to feeling better.

## 2022-06-11 ENCOUNTER — Ambulatory Visit (HOSPITAL_COMMUNITY)
Admission: RE | Admit: 2022-06-11 | Discharge: 2022-06-11 | Disposition: A | Discharge status: Home / Self Care | Payer: Managed Care, Other (non HMO) | Source: Ambulatory Visit

## 2022-06-11 ENCOUNTER — Encounter (HOSPITAL_COMMUNITY): Payer: Self-pay

## 2022-06-11 VITALS — BP 122/79 | HR 110 | Temp 98.4°F | Resp 18

## 2022-06-11 DIAGNOSIS — R1013 Epigastric pain: Secondary | ICD-10-CM | POA: Insufficient documentation

## 2022-06-11 DIAGNOSIS — R112 Nausea with vomiting, unspecified: Secondary | ICD-10-CM

## 2022-06-11 DIAGNOSIS — R5383 Other fatigue: Secondary | ICD-10-CM

## 2022-06-11 DIAGNOSIS — K5909 Other constipation: Secondary | ICD-10-CM | POA: Insufficient documentation

## 2022-06-11 LAB — TSH: TSH: 1.027 u[IU]/mL (ref 0.350–4.500)

## 2022-06-11 MED ORDER — ONDANSETRON HCL 4 MG PO TABS: 4.0000 mg | ORAL_TABLET | Freq: Three times a day (TID) | ORAL | 0 refills | Status: DC | PRN

## 2022-06-11 MED ORDER — OMEPRAZOLE 20 MG PO CPDR: 20.0000 mg | DELAYED_RELEASE_CAPSULE | Freq: Two times a day (BID) | ORAL | 1 refills | Status: DC

## 2022-06-11 NOTE — ED Triage Notes (Signed)
Pt states that she was seen at [facility] which sent her to the ED. Pt states after sitting in the ED for hours she left without being seen. Pt states that she had an IV and drew blood while she was waiting. Pt states that the pain is epigastric and radiates into the back. Pt has nausea but no vomiting. Pt states the pain is worse after eating.

## 2022-06-11 NOTE — ED Provider Notes (Signed)
[facility]    CSN: [id number] Arrival date & time: 06/11/22  1504      History   Chief Complaint Chief Complaint  Patient presents with   Abdominal Pain    Severe abdominal pain after eating, constipation, pain in back - Entered by patient    HPI Candice King is a 51 y.o. female.   51 year old female presents with abdominal pain and constipation.  Patient relates for the past several weeks she has been having increasing abdominal pain and discomfort.  Patient relates that the pain is in the epigastric and periumbilical area, it tends to occur 15 to 20 minutes after eating a meal.  Patient relates she has always had some problems with indigestion, heartburn and occasional reflux.  The patient describes the pain as a cramping sensation that comes in waves.  Patient indicates she has nausea associated with the pain but has not thrown up.  Patient relates she also has pain that occurs in the epigastric and tends to radiate around to the left flank.  Patient relates for the past week she has had constipation and has not been able to have a good bowel movement, she has taken 1 dose of Colace but this did not help.  She indicates she has always had problems having normal bowel movements.  She indicates for the past several weeks that she has been having increasing fatigue, lethargy, and tiredness.  Patient relates she does smoke regular cigarettes several during the day.  Patient denies fever or chills. [facility]and was evaluated by the provider and advised to be seen in the emergency room.  Patient relates that she went to [facility] emergency room last night and had labs drawn and IV fluids.  (Refer to the lab results on 06/10/2022-results are within normal limits except for potassium which is 2 points below normal)   Abdominal Pain Associated symptoms: constipation and fatigue     Past Medical History:  Diagnosis Date    Hyperlipidemia    Hypertension     There are no problems to display for this patient.   Past Surgical History:  Procedure Laterality Date   ABDOMINAL HYSTERECTOMY     ABDOMINAL SURGERY     CESAREAN SECTION     LAPAROSCOPIC LYSIS OF ADHESIONS N/A 02/14/2018   Procedure: LAPAROSCOPIC LYSIS OF ADHESIONS;  Surgeon: Romualdo Bolk, MD;  Location: [facility];  Service: Gynecology;  Laterality: N/A;   LAPAROSCOPIC UNILATERAL SALPINGECTOMY Bilateral 02/14/2018   Procedure: LAPAROSCOPIC SALPINGECTOMY;  Surgeon: Romualdo Bolk, MD;  Location: [facility];  Service: Gynecology;  Laterality: Bilateral;   TUBAL LIGATION      OB History     Gravida  5   Para  5   Term  3   Preterm  2   AB      Living  5      SAB      IAB      Ectopic      Multiple      Live Births  5            Home Medications    Prior to Admission medications   Medication Sig Start Date End Date Taking? Authorizing Provider  Biotin w/ Vitamins C & E (HAIR SKIN & NAILS GUMMIES PO) Take 2 each by mouth daily.   Yes [provider]  omeprazole (PRILOSEC) 20 MG capsule Take 1 capsule (20 mg total) by mouth 2 (  two) times daily before a meal. 06/11/22  Yes Ellsworth Lennox, PA-C  ondansetron [facility]) 4 MG tablet Take 1 tablet (4 mg total) by mouth every 8 (eight) hours as needed for nausea or vomiting. 06/11/22  Yes Ellsworth Lennox, PA-C  atorvastatin (LIPITOR) 10 MG tablet Take 10 mg by mouth daily.  01/11/18   [provider]  Cholecalciferol (VITAMIN D3 PO) Take 1 capsule by mouth daily.    [provider]  ibuprofen (ADVIL,MOTRIN) 800 MG tablet Take 800 mg by mouth every 8 (eight) hours as needed for headache or moderate pain.    [provider]  lisinopril (PRINIVIL,ZESTRIL) 10 MG tablet Take 10 mg by mouth daily. 11/06/17   [provider]  promethazine (PHENERGAN) 12.5 MG tablet 1-2 tablets every 4-6 hours as needed for nausea 03/15/18   Romualdo Bolk, MD   ranitidine (ZANTAC) 150 MG tablet Take 1 tablet (150 mg total) by mouth 2 (two) times daily. 03/17/18   Romualdo Bolk, MD  traMADol (ULTRAM) 50 MG tablet Take 1 tablet (50 mg total) by mouth every 6 (six) hours as needed. 02/16/18   Romualdo Bolk, MD    Family History Family History  Problem Relation Age of Onset   Thyroid disease Mother    Heart disease Maternal Grandmother    Diabetes Maternal Grandmother    Diabetes Paternal Grandmother     Social History Social History   Tobacco Use   Smoking status: Some Days    Packs/day: 0.50    Years: 20.00    Total pack years: 10.00    Types: Cigarettes   Smokeless tobacco: Never  Vaping Use   Vaping Use: Never used  Substance Use Topics   Alcohol use: Yes    Alcohol/week: 2.0 standard drinks of alcohol    Types: 2 Standard drinks or equivalent per week    Comment: occasional   Drug use: No     Allergies   Patient has no known allergies.   Review of Systems Review of Systems  Constitutional:  Positive for fatigue.  Gastrointestinal:  Positive for abdominal pain (epigastric) and constipation.     Physical Exam Triage Vital Signs ED Triage Vitals [06/11/22 1550]  Enc Vitals Group     BP 122/79     Pulse Rate (!) 110     Resp 18     Temp 98.4 F (36.9 C)     Temp Source Oral     SpO2 95 %     Weight      Height      Head Circumference      Peak Flow      Pain Score 0     Pain Loc      Pain Edu?      Excl. in GC?    No data found.  Updated Vital Signs BP 122/79 (BP Location: Left Arm)   Pulse (!) 110   Temp 98.4 F (36.9 C) (Oral)   Resp 18   SpO2 95%   Visual Acuity Right Eye Distance:   Left Eye Distance:   Bilateral Distance:    Right Eye Near:   Left Eye Near:    Bilateral Near:     Physical Exam Constitutional:      Appearance: She is well-developed.  Cardiovascular:     Rate and Rhythm: Normal rate and regular rhythm.     Heart sounds: Normal heart sounds.  Pulmonary:      Effort: Pulmonary effort is normal.  Breath sounds: Normal breath sounds and air entry. No wheezing, rhonchi or rales.  Abdominal:     General: Abdomen is flat. Bowel sounds are normal.     Palpations: Abdomen is soft.     Tenderness: There is abdominal tenderness (mild) in the epigastric area. There is guarding. There is no rebound.  Lymphadenopathy:     Cervical: No cervical adenopathy.  Neurological:     Mental Status: She is alert.      UC Treatments / Results  Labs (all labs ordered are listed, but only abnormal results are displayed) Labs Reviewed  TSH    EKG   Radiology No results found.  Procedures Procedures (including critical care time)  Medications Ordered in UC Medications - No data to display  Initial Impression / Assessment and Plan / UC Course  I have reviewed the triage vital signs and the nursing notes.  Pertinent labs & imaging results that were available during my care of the patient were reviewed by me and considered in my medical decision making (see chart for details).    Plan: 1.  Advised to take omeprazole 20 mg 1 twice daily for the next 2 weeks to see if this improves abdominal pain symptoms. 2.  Advised to take Zofran 1 tablet every 6-8 hours as needed for the nausea and cramping. 3.  Advised to take Citrucel tablets 1 tablet with 6 ounces of water 3 times a day until normal bowel movement occurs and then drop down to once daily to address the constipation. 4.  Advised to follow-up with PCP or return to urgent care if symptoms fail to improve. 5.  TSH has been drawn and is pending. Final Clinical Impressions(s) / UC Diagnoses   Final diagnoses:  Other fatigue  Epigastric pain  Nausea and vomiting, unspecified vomiting type  Other constipation     Discharge Instructions      Advised to get some Citrucel tablets at [facility].  Take 1 with 6 ounces of water 3 times a day to help normalize bowel function. Advised to take the  omeprazole 20 mg twice daily 30 minutes before meal to help decrease the abdominal pain and discomfort. Advised to take the Zofran 4 mg 1 every 8 hours as needed for nausea and to help reduce the cramping. I have drawn a TSH lab to check for thyroid function.  If you do not hear from my nurse in the next 48 hours then that means that this result will be normal. Advised to follow-up with PCP or return to urgent care if symptoms fail to improve.    ED Prescriptions     Medication Sig Dispense Auth. Provider   omeprazole (PRILOSEC) 20 MG capsule Take 1 capsule (20 mg total) by mouth 2 (two) times daily before a meal. 30 capsule Ellsworth Lennox, PA-C   ondansetron (ZOFRAN) 4 MG tablet Take 1 tablet (4 mg total) by mouth every 8 (eight) hours as needed for nausea or vomiting. 20 tablet Ellsworth Lennox, PA-C      PDMP not reviewed this encounter.   Ellsworth Lennox, PA-C 06/11/22 1641

## 2022-06-11 NOTE — Discharge Instructions (Addendum)
Advised to get some Citrucel tablets at [facility].  Take 1 with 6 ounces of water 3 times a day to help normalize bowel function. Advised to take the omeprazole 20 mg twice daily 30 minutes before meal to help decrease the abdominal pain and discomfort. Advised to take the Zofran 4 mg 1 every 8 hours as needed for nausea and to help reduce the cramping. I have drawn a TSH lab to check for thyroid function.  If you do not hear from my nurse in the next 48 hours then that means that this result will be normal. Advised to follow-up with PCP or return to urgent care if symptoms fail to improve.

## 2022-06-18 ENCOUNTER — Ambulatory Visit (INDEPENDENT_AMBULATORY_CARE_PROVIDER_SITE_OTHER): Payer: Managed Care, Other (non HMO) | Admitting: Family Medicine

## 2022-06-18 ENCOUNTER — Encounter (HOSPITAL_BASED_OUTPATIENT_CLINIC_OR_DEPARTMENT_OTHER): Payer: Self-pay | Admitting: Family Medicine

## 2022-06-18 VITALS — BP 148/88 | HR 79 | Ht 65.0 in | Wt 181.0 lb

## 2022-06-18 DIAGNOSIS — Z0000 Encounter for general adult medical examination without abnormal findings: Secondary | ICD-10-CM

## 2022-06-18 DIAGNOSIS — K5900 Constipation, unspecified: Secondary | ICD-10-CM | POA: Diagnosis not present

## 2022-06-18 DIAGNOSIS — R109 Unspecified abdominal pain: Secondary | ICD-10-CM

## 2022-06-18 NOTE — Patient Instructions (Signed)
  Medication Instructions:  Your physician recommends that you continue on your current medications as directed. Please refer to the Current Medication list given to you today. --If you need a refill on any your medications before your next appointment, please call your pharmacy first. If no refills are authorized on file call the office.-- Lab Work: Your physician has recommended that you have lab work today: No If you have labs (blood work) drawn today and your tests are completely normal, you will receive your results via MyChart message OR a phone call from our staff.  Please ensure you check your voicemail in the event that you authorized detailed messages to be left on a delegated number. If you have any lab test that is abnormal or we need to change your treatment, we will call you to review the results.  Referrals/Procedures/Imaging: No  Follow-Up: Your next appointment:   Your physician recommends that you schedule a follow-up appointment in: 2-3 months with Dr. de Cuba.  You will receive a text message or e-mail with a link to a survey about your care and experience with us today! We would greatly appreciate your feedback!   Thanks for letting us be apart of your health journey!!  Primary Care and Sports Medicine   Dr. Raymond de Cuba   We encourage you to activate your patient portal called "MyChart".  Sign up information is provided on this After Visit Summary.  MyChart is used to connect with patients for Virtual Visits (Telemedicine).  Patients are able to view lab/test results, encounter notes, upcoming appointments, etc.  Non-urgent messages can be sent to your provider as well. To learn more about what you can do with MyChart, please visit --  [email or web address].    

## 2022-06-18 NOTE — Progress Notes (Signed)
New Patient Office Visit  Subjective    Patient ID: Candice King, female    DOB: 25-Sep-1971  Age: 51 y.o. MRN: [id number]  CC:  Chief Complaint  Patient presents with   New Patient (Initial Visit)    Pt here to establish new care, she has concerns about abdominal pain and cramping it's been going on for a couple of months now     HPI Candice King presents to establish care Last PCP - no recent PCP, did follow with OBGYN previously, last visit 3-4 years ago  Abdominal pain/cramping: Pain is somewhat central. Describes as aching, not constant. Was recommended to start on omeprazole, has been taking around breakfast and lunch, about 30 minutes before eating. Reports history of constipation - at least several months of having issues with this. Has tried Dulcolax, Citrucel - neither have provided notable relief. Patient did have prior evaluation at urgent care as well as emergency department about 1 week ago.  Laboratory evaluation at that time was generally reassuring.  PPI trial was completed as above  Patient is originally from [place], has been living here for about 24 years. 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 Outside of work, activities are somewhat limited. She used to do kickboxing  Outpatient Encounter Medications as of 06/18/2022  Medication Sig   Biotin w/ Vitamins C & E (HAIR SKIN & NAILS GUMMIES PO) Take 2 each by mouth daily.   ibuprofen (ADVIL,MOTRIN) 800 MG tablet Take 800 mg by mouth every 8 (eight) hours as needed for headache or moderate pain.   ondansetron (ZOFRAN) 4 MG tablet Take 1 tablet (4 mg total) by mouth every 8 (eight) hours as needed for nausea or vomiting.   omeprazole (PRILOSEC) 20 MG capsule Take 1 capsule (20 mg total) by mouth 2 (two) times daily before a meal.   [DISCONTINUED] atorvastatin (LIPITOR) 10 MG tablet Take 10 mg by mouth daily.    [DISCONTINUED] Cholecalciferol (VITAMIN D3 PO) Take 1 capsule by mouth daily.   [DISCONTINUED]  lisinopril (PRINIVIL,ZESTRIL) 10 MG tablet Take 10 mg by mouth daily.   [DISCONTINUED] promethazine (PHENERGAN) 12.5 MG tablet 1-2 tablets every 4-6 hours as needed for nausea   [DISCONTINUED] ranitidine (ZANTAC) 150 MG tablet Take 1 tablet (150 mg total) by mouth 2 (two) times daily.   [DISCONTINUED] traMADol (ULTRAM) 50 MG tablet Take 1 tablet (50 mg total) by mouth every 6 (six) hours as needed.   No facility-administered encounter medications on file as of 06/18/2022.    Past Medical History:  Diagnosis Date   Hyperlipidemia    Hypertension     Past Surgical History:  Procedure Laterality Date   ABDOMINAL HYSTERECTOMY     ABDOMINAL SURGERY     CESAREAN SECTION     LAPAROSCOPIC LYSIS OF ADHESIONS N/A 02/14/2018   Procedure: LAPAROSCOPIC LYSIS OF ADHESIONS;  Surgeon: Romualdo Bolk, MD;  Location: [facility];  Service: Gynecology;  Laterality: N/A;   LAPAROSCOPIC UNILATERAL SALPINGECTOMY Bilateral 02/14/2018   Procedure: LAPAROSCOPIC SALPINGECTOMY;  Surgeon: Romualdo Bolk, MD;  Location: [facility];  Service: Gynecology;  Laterality: Bilateral;   TUBAL LIGATION      Family History  Problem Relation Age of Onset   Thyroid disease Mother    Heart disease Maternal Grandmother    Diabetes Maternal Grandmother    Diabetes Paternal Grandmother     Social History   Socioeconomic History   Marital status: Divorced    Spouse name: Not on file   Number of children:  Not on file   Years of education: Not on file   Highest education level: Not on file  Occupational History   Not on file  Tobacco Use   Smoking status: Some Days    Packs/day: 0.50    Years: 20.00    Total pack years: 10.00    Types: Cigarettes   Smokeless tobacco: Never  Vaping Use   Vaping Use: Never used  Substance and Sexual Activity   Alcohol use: Yes    Alcohol/week: 2.0 standard drinks of alcohol    Types: 2 Standard drinks or equivalent per week    Comment: occasional   Drug use: No   Sexual  activity: Yes    Partners: Male    Birth control/protection: Surgical    Comment: hysterectomy   Other Topics Concern   Not on file  Social History Narrative   Not on file   Social Determinants of Health   Financial Resource Strain: Not on file  Food Insecurity: Not on file  Transportation Needs: Not on file  Physical Activity: Not on file  Stress: Not on file  Social Connections: Not on file  Intimate Partner Violence: Not on file    Objective    BP (!) 148/88   Pulse 79   Ht 5\' 5"  (1.651 m)   Wt 181 lb (82.1 kg)   SpO2 98%   BMI 30.12 kg/m   Physical Exam  51 year old female in no acute distress Cardiovascular exam with regular rate and rhythm, no murmur appreciated Lungs clear to auscultation bilaterally Abdomen with normal bowel sounds, mild diffuse tenderness, some guarding present, no organomegaly  Assessment & Plan:   Problem List Items Addressed This Visit       Other   Abdominal pain - Primary    Given duration of symptoms, patient age, lack of improvement thus far with interventions, will refer to GI for further evaluation.  Did discuss possible consideration of endoscopy and colonoscopy which GI provider may consider.  Can continue with use of PPI.  Discussed additional lifestyle modification considerations, dietary changes.  Can also try MiraLAX to assist with bowel movement      Relevant Orders   Ambulatory referral to Gastroenterology   Other Visit Diagnoses     Constipation, unspecified constipation type       Relevant Orders   Ambulatory referral to Gastroenterology   Wellness examination       Relevant Orders   Lipid panel   Hemoglobin A1c      Return in about 3 months (around 09/18/2022) for CPE with [facility] a few days prior.   Piera Downs J De 09/20/2022, MD

## 2022-06-18 NOTE — Assessment & Plan Note (Signed)
Given duration of symptoms, patient age, lack of improvement thus far with interventions, will refer to GI for further evaluation.  Did discuss possible consideration of endoscopy and colonoscopy which GI provider may consider.  Can continue with use of PPI.  Discussed additional lifestyle modification considerations, dietary changes.  Can also try MiraLAX to assist with bowel movement

## 2022-06-21 ENCOUNTER — Encounter: Payer: Self-pay | Admitting: Gastroenterology

## 2022-07-20 ENCOUNTER — Ambulatory Visit: Payer: Managed Care, Other (non HMO) | Admitting: Gastroenterology

## 2022-07-21 ENCOUNTER — Ambulatory Visit: Payer: Managed Care, Other (non HMO) | Admitting: Nurse Practitioner

## 2022-07-21 ENCOUNTER — Encounter: Payer: Self-pay | Admitting: Nurse Practitioner

## 2022-07-21 VITALS — BP 136/92 | HR 105 | Temp 98.3°F | Ht 65.0 in | Wt 183.4 lb

## 2022-07-21 DIAGNOSIS — Z131 Encounter for screening for diabetes mellitus: Secondary | ICD-10-CM | POA: Diagnosis not present

## 2022-07-21 DIAGNOSIS — K5900 Constipation, unspecified: Secondary | ICD-10-CM | POA: Insufficient documentation

## 2022-07-21 DIAGNOSIS — Z136 Encounter for screening for cardiovascular disorders: Secondary | ICD-10-CM | POA: Diagnosis not present

## 2022-07-21 DIAGNOSIS — Z13220 Encounter for screening for lipoid disorders: Secondary | ICD-10-CM | POA: Diagnosis not present

## 2022-07-21 DIAGNOSIS — R109 Unspecified abdominal pain: Secondary | ICD-10-CM

## 2022-07-21 LAB — COMPREHENSIVE METABOLIC PANEL
ALT: 18 U/L (ref 0–35)
AST: 17 U/L (ref 0–37)
Albumin: 4.2 g/dL (ref 3.5–5.2)
Alkaline Phosphatase: 72 U/L (ref 39–117)
BUN: 11 mg/dL (ref 6–23)
CO2: 27 mEq/L (ref 19–32)
Calcium: 9.7 mg/dL (ref 8.4–10.5)
Chloride: 102 mEq/L (ref 96–112)
Creatinine, Ser: 0.81 mg/dL (ref 0.40–1.20)
GFR: 84.04 mL/min (ref >60.00)
Glucose, Bld: 107 mg/dL — ABNORMAL HIGH (ref 70–99)
Potassium: 4 mEq/L (ref 3.5–5.1)
Sodium: 136 mEq/L (ref 135–145)
Total Bilirubin: 0.3 mg/dL (ref 0.2–1.2)
Total Protein: 8.2 g/dL (ref 6.0–8.3)

## 2022-07-21 LAB — LIPID PANEL
Cholesterol: 318 mg/dL — ABNORMAL HIGH (ref 0–200)
HDL: 67.6 mg/dL (ref >39.00)
LDL Cholesterol: 224 mg/dL — ABNORMAL HIGH (ref 0–99)
NonHDL: 250.71
Total CHOL/HDL Ratio: 5
Triglycerides: 135 mg/dL (ref 0.0–149.0)
VLDL: 27 mg/dL (ref 0.0–40.0)

## 2022-07-21 LAB — HEMOGLOBIN A1C: Hgb A1c MFr Bld: 6.2 % (ref 4.6–6.5)

## 2022-07-21 NOTE — Assessment & Plan Note (Signed)
Check lipid panel, further recommendations may be made based upon these results. 

## 2022-07-21 NOTE — Assessment & Plan Note (Addendum)
Check hemoglobin A1c, further recommendations may be made based upon these results.

## 2022-07-21 NOTE — Progress Notes (Signed)
New Patient Office Visit  Subjective    Patient ID: Candice King, female    DOB: [birth year]  Age: 51 y.o. MRN: [id number]  CC:  Chief Complaint  Patient presents with   New Patient (Initial Visit)    To establish care   Abdominal Pain    Sever stomaching cramping and pain especially after eating   Constipation   Headache    Comes and go     HPI Candice King presents to establish care She has not been routinely following up with primary care provider in the past, but establishes now because she is been experiencing abdominal pain.  She was seen in the emergency department last month for this, no obvious etiology determined.  She was told to take omeprazole 20 mg twice a day for 2 weeks as well as Zofran as needed, and Citrucel to help address constipation.  She reports that she has done the above but has not had much improvement in her symptoms.  She reports that pain can be severe and comes in waves, triggers include eating food.  She will go sometimes a whole week without having a bowel movement, she reports this started a couple of months ago.  Per previous medical records it appears that she may have had issues with constipation previously as well.  She denies any blood in her stool or change in the caliber of her stool.  She has had nausea with vomiting at times due to the severe notes of her abdominal pain.  She is awaiting to be evaluated by gastroenterology.  Last labs show mild hyponatremia with sodium of 133, normal liver enzymes, no anemia, normal TSH.  Outpatient Encounter Medications as of 07/21/2022  Medication Sig   Biotin w/ Vitamins C & E (HAIR SKIN & NAILS GUMMIES PO) Take 2 each by mouth daily.   ibuprofen (ADVIL,MOTRIN) 800 MG tablet Take 800 mg by mouth every 8 (eight) hours as needed for headache or moderate pain.   omeprazole (PRILOSEC) 20 MG capsule Take 1 capsule (20 mg total) by mouth 2 (two) times daily before a meal.   ondansetron (ZOFRAN) 4 MG tablet Take 1  tablet (4 mg total) by mouth every 8 (eight) hours as needed for nausea or vomiting.   polyethylene glycol (MIRALAX / GLYCOLAX) 17 g packet Take 17 g by mouth daily.   No facility-administered encounter medications on file as of 07/21/2022.    Past Medical History:  Diagnosis Date   Hyperlipidemia    Hypertension     Past Surgical History:  Procedure Laterality Date   ABDOMINAL HYSTERECTOMY     ABDOMINAL SURGERY     CESAREAN SECTION     LAPAROSCOPIC LYSIS OF ADHESIONS N/A 02/14/2018   Procedure: LAPAROSCOPIC LYSIS OF ADHESIONS;  Surgeon: Romualdo Bolk, MD;  Location: [facility];  Service: Gynecology;  Laterality: N/A;   LAPAROSCOPIC UNILATERAL SALPINGECTOMY Bilateral 02/14/2018   Procedure: LAPAROSCOPIC SALPINGECTOMY;  Surgeon: Romualdo Bolk, MD;  Location: [facility];  Service: Gynecology;  Laterality: Bilateral;   TUBAL LIGATION      Family History  Problem Relation Age of Onset   Thyroid disease Mother    Heart disease Maternal Grandmother    Diabetes Maternal Grandmother    Diabetes Paternal Grandmother     Social History   Socioeconomic History   Marital status: Divorced    Spouse name: Not on file   Number of children: Not on file   Years of education: Not on file  Highest education level: Not on file  Occupational History   Not on file  Tobacco Use   Smoking status: Some Days    Packs/day: 0.50    Years: 20.00    Total pack years: 10.00    Types: Cigarettes   Smokeless tobacco: Never  Vaping Use   Vaping Use: Never used  Substance and Sexual Activity   Alcohol use: Yes    Alcohol/week: 2.0 standard drinks of alcohol    Types: 2 Standard drinks or equivalent per week    Comment: occasional   Drug use: No   Sexual activity: Yes    Partners: Male    Birth control/protection: Surgical    Comment: hysterectomy   Other Topics Concern   Not on file  Social History Narrative   Not on file   Social Determinants of Health   Financial Resource  Strain: Not on file  Food Insecurity: Not on file  Transportation Needs: Not on file  Physical Activity: Not on file  Stress: Not on file  Social Connections: Not on file  Intimate Partner Violence: Not on file    Review of Systems  Gastrointestinal:  Positive for abdominal pain, constipation, nausea and vomiting. Negative for blood in stool and diarrhea.        Objective    BP (!) 136/92   Pulse (!) 105   Temp 98.3 F (36.8 C) (Oral)   Ht 5\' 5"  (1.651 m)   Wt 183 lb 6 oz (83.2 kg)   SpO2 95%   BMI 30.52 kg/m   Physical Exam Vitals reviewed.  Constitutional:      General: She is not in acute distress.    Appearance: Normal appearance.  HENT:     Head: Normocephalic and atraumatic.  Neck:     Vascular: No carotid bruit.  Cardiovascular:     Rate and Rhythm: Normal rate and regular rhythm.     Pulses: Normal pulses.     Heart sounds: Normal heart sounds.  Pulmonary:     Effort: Pulmonary effort is normal.     Breath sounds: Normal breath sounds.  Abdominal:     General: Abdomen is flat. Bowel sounds are decreased. There is no distension.     Palpations: Abdomen is soft.     Tenderness: There is abdominal tenderness in the right upper quadrant.  Skin:    General: Skin is warm and dry.  Neurological:     General: No focal deficit present.     Mental Status: She is alert and oriented to person, place, and time.  Psychiatric:        Mood and Affect: Mood normal.        Behavior: Behavior normal.        Judgment: Judgment normal.         Assessment & Plan:   Problem List Items Addressed This Visit       Other   Abdominal pain    Etiology unclear, will get CT scan of abdomen for further evaluation.  Encourage patient to follow-up with gastroenterology as well for additional evaluation.  Further recommendations may be made based upon these results.      Relevant Orders   CT Abdomen Pelvis W Contrast   Diabetes mellitus screening - Primary    Check  hemoglobin A1c, further recommendations may be made based upon these results.      Relevant Orders   Lipid panel   Hemoglobin A1c   Comprehensive metabolic panel   Encounter  for lipid screening for cardiovascular disease    Check lipid panel, further recommendations may be made based upon these results.      Relevant Orders   Lipid panel   Hemoglobin A1c   Comprehensive metabolic panel   Constipation    Recommend she take over-the-counter MiraLAX daily, and follow-up with gastroenterology as scheduled.      Relevant Orders   CT Abdomen Pelvis W Contrast    Return in about 1 month (around 08/21/2022) for with Maralyn Sago.   Elenore Paddy, NP

## 2022-07-21 NOTE — Assessment & Plan Note (Signed)
Recommend she take over-the-counter MiraLAX daily, and follow-up with gastroenterology as scheduled.

## 2022-07-21 NOTE — Assessment & Plan Note (Signed)
Etiology unclear, will get CT scan of abdomen for further evaluation.  Encourage patient to follow-up with gastroenterology as well for additional evaluation.  Further recommendations may be made based upon these results.

## 2022-08-11 ENCOUNTER — Encounter: Payer: Self-pay | Admitting: Nurse Practitioner

## 2022-08-11 ENCOUNTER — Telehealth: Payer: Self-pay

## 2022-08-11 NOTE — Telephone Encounter (Signed)
Can you please review pt concerns

## 2022-08-11 NOTE — Telephone Encounter (Signed)
Error

## 2022-08-16 ENCOUNTER — Ambulatory Visit
Admission: RE | Admit: 2022-08-16 | Discharge: 2022-08-16 | Disposition: A | Discharge status: Home / Self Care | Payer: Managed Care, Other (non HMO) | Source: Ambulatory Visit | Attending: Nurse Practitioner | Admitting: Nurse Practitioner

## 2022-08-16 DIAGNOSIS — K5900 Constipation, unspecified: Secondary | ICD-10-CM

## 2022-08-16 DIAGNOSIS — R109 Unspecified abdominal pain: Secondary | ICD-10-CM

## 2022-08-16 MED ORDER — IOPAMIDOL (ISOVUE-300) INJECTION 61%
100.0000 mL | Freq: Once | INTRAVENOUS | Status: AC | PRN
  Administered 2022-08-16: 100 mL via INTRAVENOUS

## 2022-08-20 ENCOUNTER — Ambulatory Visit: Payer: Managed Care, Other (non HMO) | Admitting: Nurse Practitioner

## 2022-08-20 VITALS — BP 122/86 | HR 94 | Temp 97.8°F | Ht 65.0 in | Wt 184.2 lb

## 2022-08-20 DIAGNOSIS — E669 Obesity, unspecified: Secondary | ICD-10-CM | POA: Insufficient documentation

## 2022-08-20 DIAGNOSIS — E785 Hyperlipidemia, unspecified: Secondary | ICD-10-CM | POA: Diagnosis not present

## 2022-08-20 DIAGNOSIS — D7389 Other diseases of spleen: Secondary | ICD-10-CM

## 2022-08-20 DIAGNOSIS — K8020 Calculus of gallbladder without cholecystitis without obstruction: Secondary | ICD-10-CM | POA: Insufficient documentation

## 2022-08-20 DIAGNOSIS — K5904 Chronic idiopathic constipation: Secondary | ICD-10-CM | POA: Insufficient documentation

## 2022-08-20 DIAGNOSIS — R7303 Prediabetes: Secondary | ICD-10-CM

## 2022-08-20 DIAGNOSIS — K8010 Calculus of gallbladder with chronic cholecystitis without obstruction: Secondary | ICD-10-CM | POA: Insufficient documentation

## 2022-08-20 DIAGNOSIS — M940 Chondrocostal junction syndrome [Tietze]: Secondary | ICD-10-CM | POA: Insufficient documentation

## 2022-08-20 NOTE — Assessment & Plan Note (Signed)
She was counseled on diet and exercise recommendations aimed at helping her with weight loss.  Handout provided as well.  Patient to follow-up in 3 months to see how she is tolerating with lifestyle modification.

## 2022-08-20 NOTE — Assessment & Plan Note (Signed)
Finding on CT scan for evaluation of abdominal pain.  Will order MRI for further evaluation as recommended by radiologist.  Patient denies having any pacemaker or implanted metal.

## 2022-08-20 NOTE — Progress Notes (Signed)
Established Patient Office Visit  Subjective   Patient ID: Candice King, female    DOB: 07-22-1971  Age: 51 y.o. MRN: [id number]  Chief Complaint  Patient presents with   Follow-up    1 month follow up  Had CT done multiple lesions   Blurred Vision    Light headed and blurred vision     Abdominal pain: She continues to have abdominal pain and reports its actually got a bit worse since saw her last time.  She has establish care with gastroenterologist and had abdominal CT scan completed.  CT scan did show multiple gallstones and she has been referred to general surgery by her GI doctor.  She is waiting to hear from general surgery to have an appointment scheduled to discuss treatment options.  CT scan did show lesions in her spleen, radiologist recommended MRI for further evaluation.  Patient also reports that she plans on undergoing colonoscopy with GI, she has been trialing use of Trulance for her constipation.  Taking Trulance does help her have a bowel movement, however she reports a sense of fecal urgency when taking it.  Hyperlipidemia/prediabetes: Labs at last office visit show significant hyperlipidemia with LDL of 224, not clear whether or not patient was fasting but she reports that generally she does not eat breakfast so this very well may have been a fasting level.  She does report that her brother had heart attack and stroke in his 38s, she had a grandmother who had stroke in her 39s, her mom has hyperlipidemia, she has an aunt who had stroke in her 71s, and her sister has hyperlipidemia.  She is currently asymptomatic.  Her A1c was 6.2.    Review of Systems  Gastrointestinal:  Positive for abdominal pain (seems to be worsening) and constipation.      Objective:     BP 122/86   Pulse 94   Temp 97.8 F (36.6 C) (Oral)   Ht 5\' 5"  (1.651 m)   Wt 184 lb 4 oz (83.6 kg)   SpO2 96%   BMI 30.66 kg/m    Physical Exam Vitals reviewed.  Constitutional:      General: She  is not in acute distress.    Appearance: Normal appearance.  HENT:     Head: Normocephalic and atraumatic.  Neck:     Vascular: No carotid bruit.  Cardiovascular:     Rate and Rhythm: Normal rate and regular rhythm.     Pulses: Normal pulses.     Heart sounds: Normal heart sounds.  Pulmonary:     Effort: Pulmonary effort is normal.     Breath sounds: Normal breath sounds.  Skin:    General: Skin is warm and dry.  Neurological:     General: No focal deficit present.     Mental Status: She is alert and oriented to person, place, and time.  Psychiatric:        Mood and Affect: Mood normal.        Behavior: Behavior normal.        Judgment: Judgment normal.      No results found for any visits on 08/20/22.  Last metabolic panel Lab Results  Component Value Date   GLUCOSE 107 (H) 07/21/2022   NA 136 07/21/2022   K 4.0 07/21/2022   CL 102 07/21/2022   CO2 27 07/21/2022   BUN 11 07/21/2022   CREATININE 0.81 07/21/2022   GFRNONAA >60 06/10/2022   CALCIUM 9.7 07/21/2022   PROT 8.2  07/21/2022   ALBUMIN 4.2 07/21/2022   BILITOT 0.3 07/21/2022   ALKPHOS 72 07/21/2022   AST 17 07/21/2022   ALT 18 07/21/2022   ANIONGAP 10 06/10/2022   Last lipids Lab Results  Component Value Date   CHOL 318 (H) 07/21/2022   HDL 67.60 07/21/2022   LDLCALC 224 (H) 07/21/2022   TRIG 135.0 07/21/2022   CHOLHDL 5 07/21/2022   Last hemoglobin A1c Lab Results  Component Value Date   HGBA1C 6.2 07/21/2022      The 10-year ASCVD risk score (Arnett DK, et al., 2019) is: 4.1%    Assessment & Plan:   Problem List Items Addressed This Visit       Other   Splenic lesion - Primary    Finding on CT scan for evaluation of abdominal pain.  Will order MRI for further evaluation as recommended by radiologist.  Patient denies having any pacemaker or implanted metal.      Relevant Orders   MR Abdomen W Wo Contrast   Hyperlipidemia    She has severe hyperlipidemia, ASCVD risk score still  fairly low at 4.1%.  We had shared decision making discussion regarding initiating treatment now versus seeing a lipid specialist.  Due to patient's abdominal discomfort, she would prefer to hold off on initiating medication and try to focus on lifestyle modification and follow-up with lipid specialist.  Patient will follow-up with me in about 3 months, hopefully at that point she will have had her gallbladder removed at this is the treatment modality that is chosen.  Maybe we can consider trialing statin therapy at that time when she has recovered.  Referral to lipid specialist made today as well.  She was also counseled on diet and exercise recommendations aimed at helping her with weight loss.      Relevant Orders   AMB Referral to [facility]   Lipid panel   Comprehensive metabolic panel   Prediabetes    She was counseled on diet and exercise recommendations aimed at helping her with weight loss.  Handout provided as well.  Patient to follow-up in 3 months to see how she is tolerating with lifestyle modification.      Relevant Orders   Hemoglobin A1c    Return in about 3 months (around 11/19/2022) for F/u With Karenann Mcgrory.    Elenore Paddy, NP

## 2022-08-20 NOTE — Patient Instructions (Signed)
Follow a low glycemic diet in addition to mediterranean diet (essentially eat veggies and lean proteins).   150 minutes/week of physical activity

## 2022-08-20 NOTE — Assessment & Plan Note (Signed)
She has severe hyperlipidemia, ASCVD risk score still fairly low at 4.1%.  We had shared decision making discussion regarding initiating treatment now versus seeing a lipid specialist.  Due to patient's abdominal discomfort, she would prefer to hold off on initiating medication and try to focus on lifestyle modification and follow-up with lipid specialist.  Patient will follow-up with me in about 3 months, hopefully at that point she will have had her gallbladder removed at this is the treatment modality that is chosen.  Maybe we can consider trialing statin therapy at that time when she has recovered.  Referral to lipid specialist made today as well.  She was also counseled on diet and exercise recommendations aimed at helping her with weight loss.

## 2022-08-23 ENCOUNTER — Encounter: Payer: Self-pay | Admitting: Nurse Practitioner

## 2022-08-27 ENCOUNTER — Telehealth: Payer: Self-pay | Admitting: Nurse Practitioner

## 2022-08-27 NOTE — Telephone Encounter (Signed)
Left voice mail for pt to call back to schedule them for an appointment

## 2022-08-27 NOTE — Telephone Encounter (Signed)
Please call patient and have her scheduled for a visit with me to discuss her lightheadedness as soon as possible. Please educate her that if she has any chest pain, visual changes/loss, difficulty talking/speaking/swallowing, or if she experiences numbness then she should call 911.

## 2022-09-02 NOTE — Telephone Encounter (Signed)
Documentation in pt chart, Called pt for set up an appoint but to no avail Left voice mail for pt to call back

## 2022-09-02 NOTE — Telephone Encounter (Signed)
Called pt and left voice mail for follow up in regards setting up an appointment for the discussion of her lightheadedness and also to advice her based on Nelva Bush advise.

## 2022-09-06 ENCOUNTER — Ambulatory Visit (HOSPITAL_BASED_OUTPATIENT_CLINIC_OR_DEPARTMENT_OTHER): Payer: PRIVATE HEALTH INSURANCE

## 2022-09-09 ENCOUNTER — Ambulatory Visit
Admission: RE | Admit: 2022-09-09 | Discharge: 2022-09-09 | Disposition: A | Discharge status: Home / Self Care | Payer: Managed Care, Other (non HMO) | Source: Ambulatory Visit | Attending: Nurse Practitioner | Admitting: Nurse Practitioner

## 2022-09-09 DIAGNOSIS — D7389 Other diseases of spleen: Secondary | ICD-10-CM

## 2022-09-09 MED ORDER — GADOPICLENOL 0.5 MMOL/ML IV SOLN
8.0000 mL | Freq: Once | INTRAVENOUS | Status: AC | PRN
  Administered 2022-09-09: 8 mL via INTRAVENOUS

## 2022-09-10 ENCOUNTER — Ambulatory Visit: Payer: PRIVATE HEALTH INSURANCE | Admitting: Nurse Practitioner

## 2022-09-14 ENCOUNTER — Ambulatory Visit: Payer: Self-pay | Admitting: Surgery

## 2022-09-17 ENCOUNTER — Encounter (HOSPITAL_BASED_OUTPATIENT_CLINIC_OR_DEPARTMENT_OTHER): Payer: PRIVATE HEALTH INSURANCE | Admitting: Family Medicine

## 2022-09-17 NOTE — Telephone Encounter (Signed)
Pt had an appointment set up at 09/10/22, but cancelled, pt have another upcoming appointment on the 11/19/22

## 2022-10-07 ENCOUNTER — Other Ambulatory Visit: Payer: Self-pay | Admitting: Surgery

## 2022-11-19 ENCOUNTER — Ambulatory Visit: Payer: Managed Care, Other (non HMO) | Admitting: Nurse Practitioner

## 2022-11-26 ENCOUNTER — Ambulatory Visit: Payer: Managed Care, Other (non HMO) | Admitting: Nurse Practitioner

## 2022-12-15 ENCOUNTER — Ambulatory Visit (HOSPITAL_BASED_OUTPATIENT_CLINIC_OR_DEPARTMENT_OTHER): Payer: Managed Care, Other (non HMO) | Admitting: Internal Medicine

## 2022-12-15 ENCOUNTER — Telehealth: Payer: Self-pay | Admitting: Internal Medicine

## 2022-12-15 ENCOUNTER — Encounter (HOSPITAL_BASED_OUTPATIENT_CLINIC_OR_DEPARTMENT_OTHER): Payer: Self-pay | Admitting: Internal Medicine

## 2022-12-15 VITALS — BP 136/98 | HR 116 | Ht 65.0 in | Wt 184.0 lb

## 2022-12-15 DIAGNOSIS — Z79899 Other long term (current) drug therapy: Secondary | ICD-10-CM

## 2022-12-15 DIAGNOSIS — E7800 Pure hypercholesterolemia, unspecified: Secondary | ICD-10-CM | POA: Diagnosis not present

## 2022-12-15 DIAGNOSIS — I700 Atherosclerosis of aorta: Secondary | ICD-10-CM | POA: Diagnosis not present

## 2022-12-15 DIAGNOSIS — I10 Essential (primary) hypertension: Secondary | ICD-10-CM

## 2022-12-15 MED ORDER — TELMISARTAN 40 MG PO TABS: 40.0000 mg | ORAL_TABLET | Freq: Every day | ORAL | 3 refills | Status: DC

## 2022-12-15 MED ORDER — ATORVASTATIN CALCIUM 40 MG PO TABS: 40.0000 mg | ORAL_TABLET | Freq: Every day | ORAL | 3 refills | Status: DC

## 2022-12-15 NOTE — Telephone Encounter (Signed)
Genetic test for dyslipidemia/ASCVD ordered ([facility]) Cheek swab completed in office Specimen and necessary paperwork mailed. ID: [id number]

## 2022-12-15 NOTE — Patient Instructions (Signed)
Medication Instructions:  START telmisartan 40mg  daily -- for BP START atorvastatin 40mg  daily -- for cholesterol   *If you need a refill on your cardiac medications before your next appointment, please call your pharmacy*   Lab Work: Non-Fasting CMET -- 2 weeks after starting telmisartan  Fasting NMR lipoprofile, LPa in 3-4 months ** complete about 1 week before next appointment  Genetic Test -- results available in 3-4 weeks via email. Dr. Debara Pickett will reach out to explain. If not covered by insurance, the [facility] will bill max $299  If you have labs (blood work) drawn today and your tests are completely normal, you will receive your results only by: [facility] (if you have MyChart) OR A paper copy in the mail If you have any lab test that is abnormal or we need to change your treatment, we will call you to review the results.   Follow-Up: At [facility], you and your health needs are our priority.  As part of our continuing mission to provide you with exceptional heart care, we have created designated Provider Care Teams.  These Care Teams include your primary Cardiologist (physician) and Advanced Practice Providers (APPs -  Physician Assistants and Nurse Practitioners) who all work together to provide you with the care you need, when you need it.  We recommend signing up for the patient portal called "MyChart".  Sign up information is provided on this After Visit Summary.  MyChart is used to connect with patients for Virtual Visits (Telemedicine).  Patients are able to view lab/test results, encounter notes, upcoming appointments, etc.  Non-urgent messages can be sent to your provider as well.   To learn more about what you can do with MyChart, go to [email or web address].    Your next appointment:    3-4 months with Dr. Debara Pickett

## 2022-12-15 NOTE — Progress Notes (Signed)
LIPID CLINIC CONSULT NOTE  Chief Complaint:  Manage dyslipidemia  Primary Care Physician: Ailene Ards, NP  Primary Cardiologist:  None  HPI:  Candice King is a 52 y.o. female who is being seen today for the evaluation of dyslipidemia at the request of Ailene Ards, NP.  This is a pleasant 52 year old female kindly referred for evaluation management of dyslipidemia.  She lives in [place] but works for the [employer].  She previously had care through [facility] and then recently was seen by [facility].  She has requested transitioning her care here to [facility] and has plan to schedule a new primary care provider with Dr. Burnard Bunting.  She does have a history of high cholesterol in the past and more recently was noted that her total cholesterol was 318, triglycerides 135, HDL 67 and LDL 224.  She was previously on atorvastatin for period of time which she said she tolerated but the medicine simply ran out and she did not follow-up.  She was also on lisinopril for hypertension but that medicine was not continued as well.  Blood pressure at her recent office visit was noted to be high and again blood pressure today is elevated at 136/98.  I repeated it personally and it was 140/98.  Hypertension runs in both parents as well as dyslipidemia and she noted a brother who died in his 92s of heart disease.  She did have some mental imaging in September 2023 of the abdomen pelvis which was a CT scan.  This showed aortic atherosclerosis.  PMHx:  Past Medical History:  Diagnosis Date   Hyperlipidemia    Hypertension     Past Surgical History:  Procedure Laterality Date   ABDOMINAL HYSTERECTOMY     ABDOMINAL SURGERY     CESAREAN SECTION     LAPAROSCOPIC LYSIS OF ADHESIONS N/A 02/14/2018   Procedure: LAPAROSCOPIC LYSIS OF ADHESIONS;  Surgeon: Salvadore Dom, MD;  Location: [facility];  Service: Gynecology;  Laterality: N/A;   LAPAROSCOPIC UNILATERAL SALPINGECTOMY Bilateral  02/14/2018   Procedure: LAPAROSCOPIC SALPINGECTOMY;  Surgeon: Salvadore Dom, MD;  Location: [facility];  Service: Gynecology;  Laterality: Bilateral;   TUBAL LIGATION      FAMHx:  Family History  Problem Relation Age of Onset   Thyroid disease Mother    Heart disease Maternal Grandmother    Diabetes Maternal Grandmother    Diabetes Paternal Grandmother     SOCHx:   reports that she has been smoking cigarettes. She has a 10.00 pack-year smoking history. She has never used smokeless tobacco. She reports current alcohol use of about 2.0 standard drinks of alcohol per week. She reports that she does not use drugs.  ALLERGIES:  No Known Allergies  ROS: Pertinent items noted in HPI and remainder of comprehensive ROS otherwise negative.  HOME MEDS: Current Outpatient Medications on File Prior to Visit  Medication Sig Dispense Refill   Biotin w/ Vitamins C & E (HAIR SKIN & NAILS GUMMIES PO) Take 2 each by mouth daily.     ibuprofen (ADVIL,MOTRIN) 800 MG tablet Take 800 mg by mouth every 8 (eight) hours as needed for headache or moderate pain.     No current facility-administered medications on file prior to visit.    LABS/IMAGING: No results found for this or any previous visit (from the past 48 hour(s)). No results found.  LIPID PANEL:    Component Value Date/Time   CHOL 318 (H) 07/21/2022 1115   TRIG 135.0 07/21/2022 1115  HDL 67.60 07/21/2022 1115   CHOLHDL 5 07/21/2022 1115   VLDL 27.0 07/21/2022 1115   LDLCALC 224 (H) 07/21/2022 1115    WEIGHTS: Wt Readings from Last 3 Encounters:  12/15/22 184 lb (83.5 kg)  08/20/22 184 lb 4 oz (83.6 kg)  07/21/22 183 lb 6 oz (83.2 kg)    VITALS: BP (!) 136/98   Pulse (!) 116   Ht 5\' 5"  (1.651 m)   Wt 184 lb (83.5 kg)   BMI 30.62 kg/m   EXAM: General appearance: alert and no distress Neck: no carotid bruit, no JVD, and thyroid not enlarged, symmetric, no tenderness/mass/nodules Lungs: clear to auscultation  bilaterally Heart: regular rate and rhythm, S1, S2 normal, no murmur, click, rub or gallop Abdomen: soft, non-tender; bowel sounds normal; no masses,  no organomegaly Extremities: extremities normal, atraumatic, no cyanosis or edema Pulses: 2+ and symmetric Skin: Skin color, texture, turgor normal. No rashes or lesions Neurologic: Grossly normal Psych: Pleasant  EKG: Deferred  ASSESSMENT: Possible familial hyperlipidemia, LDL greater than 190 Uncontrolled hypertension Family history of dyslipidemia Aortic atherosclerosis  PLAN: 1.   Ms. Vanzile has a possible familial hyperlipidemia with LDL greater than 190.  She had previously been on atorvastatin which she said she tolerated albeit at a low 10 mg dose.  Per guidelines she needs high intensity statin therapy and would recommend starting 40 mg atorvastatin nightly.  Will need to check an NMR and LP(a) which may be elevated.  She does have aortic atherosclerosis.  In addition her blood pressure was elevated today.  A manual recheck about 10 minutes after the original value was still elevated.  She has had elevated blood pressures as well and does not monitor it at home.  I advised her to purchase a blood pressure cuff and take regular home blood pressure readings.  I would advise starting treatment today with telmisartan 40 mg daily.  She will need a metabolic profile in about 2 weeks to follow-up on potassium, renal function and liver enzymes.  Otherwise repeat a lipid profile in about 3 to 4 months and follow-up with me at that time.  Contact us with the results of her blood pressure readings after being established on the medicine.  Thanks again for the kind referral.  Pixie Casino, MD, FACC, [place] Director of the [facility]  [facility] Diplomate of the American Board of Clinical Lipidology Attending Cardiologist  Direct Dial: [phone]  Fax:  [phone]  Website:  [email or web address] Asbury 12/15/2022, 9:02 AM

## 2022-12-17 ENCOUNTER — Ambulatory Visit: Payer: Managed Care, Other (non HMO) | Admitting: Nurse Practitioner

## 2022-12-27 ENCOUNTER — Ambulatory Visit (HOSPITAL_BASED_OUTPATIENT_CLINIC_OR_DEPARTMENT_OTHER): Payer: Managed Care, Other (non HMO) | Admitting: Family Medicine

## 2022-12-27 ENCOUNTER — Encounter (HOSPITAL_BASED_OUTPATIENT_CLINIC_OR_DEPARTMENT_OTHER): Payer: Self-pay | Admitting: Internal Medicine

## 2022-12-30 NOTE — Telephone Encounter (Signed)
Diastolic BP is low -would cut the telmisartan to 20 mg daily and see if dizziness improves  -Mali

## 2023-01-13 ENCOUNTER — Encounter (HOSPITAL_BASED_OUTPATIENT_CLINIC_OR_DEPARTMENT_OTHER): Payer: Self-pay | Admitting: Internal Medicine

## 2023-01-25 ENCOUNTER — Ambulatory Visit (HOSPITAL_BASED_OUTPATIENT_CLINIC_OR_DEPARTMENT_OTHER): Payer: Managed Care, Other (non HMO) | Admitting: Family Medicine

## 2023-02-02 ENCOUNTER — Other Ambulatory Visit: Payer: Self-pay | Admitting: Nurse Practitioner

## 2023-02-02 DIAGNOSIS — D7389 Other diseases of spleen: Secondary | ICD-10-CM

## 2023-02-02 NOTE — Progress Notes (Signed)
Please call patient and let her know that she is due for follow-up MRI to evaluate the splenic lesions seed about 6 months ago. I have placed the order and scheduling should reach out to her soon to have MRI scheduled.

## 2023-02-04 ENCOUNTER — Ambulatory Visit: Payer: Managed Care, Other (non HMO) | Admitting: Nurse Practitioner

## 2023-02-04 VITALS — BP 132/78 | HR 105 | Temp 98.0°F | Ht 65.0 in | Wt 186.0 lb

## 2023-02-04 DIAGNOSIS — Z6830 Body mass index (BMI) 30.0-30.9, adult: Secondary | ICD-10-CM

## 2023-02-04 DIAGNOSIS — E785 Hyperlipidemia, unspecified: Secondary | ICD-10-CM

## 2023-02-04 DIAGNOSIS — E669 Obesity, unspecified: Secondary | ICD-10-CM | POA: Diagnosis not present

## 2023-02-04 DIAGNOSIS — I10 Essential (primary) hypertension: Secondary | ICD-10-CM

## 2023-02-04 DIAGNOSIS — D7389 Other diseases of spleen: Secondary | ICD-10-CM | POA: Diagnosis not present

## 2023-02-04 NOTE — Assessment & Plan Note (Signed)
Chronic, follow-up with Dr. Debara Pickett and have labs collected as ordered by him.  Continue atorvastatin as prescribed.

## 2023-02-04 NOTE — Patient Instructions (Addendum)
1100-1200 calories/day My fitness pal, fit bit Food scale 60 - 80 ounces water per day 150 min/wk Low glycemic index and mediterranean diet   Wegovy Zepbound Saxenda   Any GLP1's for weight loss

## 2023-02-04 NOTE — Assessment & Plan Note (Signed)
For now we will start with lifestyle modification.  Recommended calorie deficit, counting calories, increasing exercise to 150 minutes/week, hydrating well daily.  Patient to follow-up in 6 to 8 weeks to see progress.

## 2023-02-04 NOTE — Assessment & Plan Note (Signed)
Seen on MRI about 6 months ago, recommend repeat as recommended by radiologist.  Patient has MRI scheduled for early April.

## 2023-02-04 NOTE — Progress Notes (Signed)
Made pt aware of follow up MRI, pt stated she was called in regards to that to be schedule for it

## 2023-02-04 NOTE — Assessment & Plan Note (Signed)
Chronic, blood pressure at goal.  Continue telmisartan 20 mg daily.  Have labs completed as ordered by Dr. Debara Pickett, follow-up with him as scheduled.

## 2023-02-04 NOTE — Progress Notes (Signed)
Established Patient Office Visit  Subjective   Patient ID: Candice King, female    DOB: 03-23-1971  Age: 52 y.o. 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  Radiologist at that time recommended follow-up MRI in 6 months.  Patient like to discuss this.  Patient would also like to discuss weight loss.  Reports she has tried to lose weight over the last few months and has been unsuccessful with weight loss.  She has tried changing diet and increasing her exercise.  Has never been a part of a medically supervised weight loss program nor has she taken prescription medication for weight loss before.  Hyperlipidemia: Following with Dr. Debara Pickett.  Currently on atorvastatin daily.  Tolerating medication well.  Due to have follow-up labs ordered by him soon. Hypertension: Currently on telmisartan 20 mg daily, tolerating well.    Review of Systems  Respiratory:  Negative for shortness of breath.   Cardiovascular:  Negative for chest pain.  Neurological:  Negative for dizziness.      Objective:     BP 132/78   Pulse (!) 105   Temp 98 F (36.7 C) (Oral)   Ht 5\' 5"  (1.651 m)   Wt 186 lb (84.4 kg)   SpO2 95%   BMI 30.95 kg/m  BP Readings from Last 3 Encounters:  02/04/23 132/78  12/15/22 (!) 136/98  08/20/22 122/86   Wt Readings from Last 3 Encounters:  02/04/23 186 lb (84.4 kg)  12/15/22 184 lb (83.5 kg)  08/20/22 184 lb 4 oz (83.6 kg)      Physical Exam Vitals reviewed.  Constitutional:      General: She is not in acute distress.    Appearance: Normal appearance.  HENT:     Head: Normocephalic and atraumatic.  Neck:     Vascular: No carotid bruit.  Cardiovascular:     Rate and Rhythm: Normal rate and  regular rhythm.     Pulses: Normal pulses.     Heart sounds: Normal heart sounds.  Pulmonary:     Effort: Pulmonary effort is normal.     Breath sounds: Normal breath sounds.  Skin:    General: Skin is warm and dry.  Neurological:     General: No focal deficit present.     Mental Status: She is alert and oriented to person, place, and time.  Psychiatric:        Mood and Affect: Mood normal.        Behavior: Behavior normal.        Judgment: Judgment normal.      No results found for any visits on 02/04/23.    The 10-year ASCVD risk score (Arnett DK, et al., 2019) is: 9.6%    Assessment & Plan:   Problem List Items Addressed This Visit       Cardiovascular and Mediastinum   HTN, goal below 140/90    Chronic, blood pressure at goal.  Continue telmisartan 20 mg daily.  Have labs completed as ordered by Dr. Debara Pickett, follow-up with him as scheduled.        Other   Obesity    For now we will start with lifestyle modification.  Recommended calorie deficit, counting calories, increasing exercise to 150 minutes/week, hydrating well daily.  Patient to follow-up in 6 to 8 weeks to see progress.      Splenic lesion - Primary    Seen on MRI about 6 months ago, recommend repeat as recommended by radiologist.  Patient has MRI scheduled for early April.      Hyperlipidemia    Chronic, follow-up with Dr. Debara Pickett and have labs collected as ordered by him.  Continue atorvastatin as prescribed.       Return in about 6 weeks (around 03/18/2023) for F/U with Judson Roch can be video visit.    Ailene Ards, NP

## 2023-02-08 ENCOUNTER — Encounter: Payer: Self-pay | Admitting: Nurse Practitioner

## 2023-02-10 ENCOUNTER — Ambulatory Visit (HOSPITAL_BASED_OUTPATIENT_CLINIC_OR_DEPARTMENT_OTHER): Payer: Managed Care, Other (non HMO) | Admitting: Internal Medicine

## 2023-02-21 ENCOUNTER — Other Ambulatory Visit: Payer: Managed Care, Other (non HMO)

## 2023-03-03 ENCOUNTER — Ambulatory Visit
Admission: RE | Admit: 2023-03-03 | Discharge: 2023-03-03 | Disposition: A | Discharge status: Home / Self Care | Payer: Managed Care, Other (non HMO) | Source: Ambulatory Visit | Attending: Internal Medicine | Admitting: Internal Medicine

## 2023-03-03 ENCOUNTER — Ambulatory Visit: Payer: Managed Care, Other (non HMO)

## 2023-03-03 VITALS — BP 137/88 | HR 98 | Temp 98.7°F | Resp 18

## 2023-03-03 DIAGNOSIS — M5442 Lumbago with sciatica, left side: Secondary | ICD-10-CM | POA: Diagnosis not present

## 2023-03-03 MED ORDER — CYCLOBENZAPRINE HCL 5 MG PO TABS: 5.0000 mg | ORAL_TABLET | Freq: Three times a day (TID) | ORAL | 0 refills | Status: DC | PRN

## 2023-03-03 MED ORDER — TRIAMCINOLONE ACETONIDE 40 MG/ML IJ SUSP
40.0000 mg | Freq: Once | INTRAMUSCULAR | Status: AC
  Administered 2023-03-03: 40 mg via INTRAMUSCULAR
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
## 2023-03-03 NOTE — Discharge Instructions (Addendum)
Your x-rays showed no broken bones or dislocations. They were sent to a radiologist for further evaluation and we will call you if the radiologist sees anything different.   You were given an injection (Kenalog) for your back pain today. This is a steroid that will help with the pain and inflammation.   You were also given a prescription for a muscle relaxer (Flexeril) take at bedtime when you are not driving or working because it may cause dizziness/drowsiness.  If not allergic, you may use over the counter ibuprofen or acetaminophen as needed.  Return in 2 to 3 days if no improvement. Your evaluation was not suggestive of any emergent condition requiring medical intervention at this time. However, our office is limited in the tests and imaging we can perform at our facility.  Therefore, it is very important for you to pay attention to any new symptoms or worsening of your current condition.   Please go directly to the Emergency Department immediately should you begin to feel worse in any way or have any of the following symptoms: bowel/urinary incontinence, numbness between your legs, fever new onset numbness/tingling.

## 2023-03-18 ENCOUNTER — Other Ambulatory Visit (HOSPITAL_COMMUNITY): Payer: Self-pay

## 2023-03-18 ENCOUNTER — Telehealth: Payer: Self-pay | Admitting: Nurse Practitioner

## 2023-03-18 ENCOUNTER — Ambulatory Visit: Payer: Managed Care, Other (non HMO) | Admitting: Nurse Practitioner

## 2023-03-18 VITALS — BP 118/74 | HR 99 | Temp 97.8°F | Ht 65.0 in | Wt 182.5 lb

## 2023-03-18 DIAGNOSIS — E669 Obesity, unspecified: Secondary | ICD-10-CM

## 2023-03-18 DIAGNOSIS — Z6830 Body mass index (BMI) 30.0-30.9, adult: Secondary | ICD-10-CM | POA: Diagnosis not present

## 2023-03-18 DIAGNOSIS — D7389 Other diseases of spleen: Secondary | ICD-10-CM

## 2023-03-18 DIAGNOSIS — R7303 Prediabetes: Secondary | ICD-10-CM | POA: Diagnosis not present

## 2023-03-18 DIAGNOSIS — E785 Hyperlipidemia, unspecified: Secondary | ICD-10-CM | POA: Diagnosis not present

## 2023-03-18 LAB — COMPREHENSIVE METABOLIC PANEL
ALT: 26 U/L (ref 0–35)
AST: 20 U/L (ref 0–37)
Albumin: 4.2 g/dL (ref 3.5–5.2)
Alkaline Phosphatase: 98 U/L (ref 39–117)
BUN: 12 mg/dL (ref 6–23)
CO2: 29 mEq/L (ref 19–32)
Calcium: 9.6 mg/dL (ref 8.4–10.5)
Chloride: 102 mEq/L (ref 96–112)
Creatinine, Ser: 0.83 mg/dL (ref 0.40–1.20)
GFR: 81.24 mL/min (ref >60.00)
Glucose, Bld: 97 mg/dL (ref 70–99)
Potassium: 4.1 mEq/L (ref 3.5–5.1)
Sodium: 138 mEq/L (ref 135–145)
Total Bilirubin: 0.3 mg/dL (ref 0.2–1.2)
Total Protein: 8 g/dL (ref 6.0–8.3)

## 2023-03-18 LAB — CBC
HCT: 41 % (ref 36.0–46.0)
Hemoglobin: 14 g/dL (ref 12.0–15.0)
MCHC: 34.1 g/dL (ref 30.0–36.0)
MCV: 90.2 fl (ref 78.0–100.0)
Platelets: 353 10*3/uL (ref 150.0–400.0)
RBC: 4.54 Mil/uL (ref 3.87–5.11)
RDW: 13.7 % (ref 11.5–15.5)
WBC: 5.3 10*3/uL (ref 4.0–10.5)

## 2023-03-18 LAB — HEMOGLOBIN A1C: Hgb A1c MFr Bld: 6.1 % (ref 4.6–6.5)

## 2023-03-18 MED ORDER — WEGOVY 0.25 MG/0.5ML ~~LOC~~ SOAJ
0.2500 mg | SUBCUTANEOUS | 0 refills | Status: DC
Start: 2023-03-18 — End: 2023-04-29
  Filled 2023-03-18: qty 2, 28d supply, fill #0

## 2023-03-18 MED ORDER — WEGOVY 0.5 MG/0.5ML ~~LOC~~ SOAJ
0.5000 mg | SUBCUTANEOUS | 1 refills | Status: DC
Start: 2023-03-18 — End: 2023-04-29
  Filled 2023-03-18: qty 2, fill #0
  Filled 2023-04-11 – 2023-04-14 (×2): qty 2, 28d supply, fill #0

## 2023-03-18 NOTE — Progress Notes (Signed)
Established Patient Office Visit  Subjective   Patient ID: Candice King, female    DOB: [birth year]  Age: 52 y.o. 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  Patient has elected not to complete follow-up MRI would prefer to just monitor blood counts, and if abnormalities are noted on labs to then proceed with MRI.  Obesity: Has been trying to lose weight for greater than 1 year without success.  Reports having made changes to her diet including reducing intake of pork, beef, fried food.  Has also been exercising 4 days a week for 1 hour each, reports running and walking interval training in the park.  She has tried over-the-counter supplements in the past for weight loss.  BMI today is 30.37.  Patient also has hypertension, hyperlipidemia, and prediabetes is comorbid conditions.    Review of Systems  Respiratory:  Negative for shortness of breath.   Cardiovascular:  Negative for chest pain.      Objective:     BP 118/74   Pulse 99   Temp 97.8 F (36.6 C) (Tympanic)   Ht 5\' 5"  (1.651 m)   Wt 182 lb 8 oz (82.8 kg)   SpO2 98%   BMI 30.37 kg/m  BP Readings from Last 3 Encounters:  03/18/23 118/74  03/03/23 137/88  02/04/23 132/78   Wt Readings from Last 3 Encounters:  03/18/23 182 lb 8 oz (82.8 kg)  02/04/23 186 lb (84.4 kg)  12/15/22 184 lb (83.5 kg)      Physical Exam Vitals reviewed.  Constitutional:      General: She is not in acute distress.    Appearance: Normal appearance.  HENT:     Head: Normocephalic and atraumatic.  Neck:     Vascular: No carotid bruit.  Cardiovascular:     Rate and Rhythm: Normal rate and regular rhythm.     Pulses: Normal pulses.     Heart sounds: Normal  heart sounds.  Pulmonary:     Effort: Pulmonary effort is normal.     Breath sounds: Normal breath sounds.  Skin:    General: Skin is warm and dry.  Neurological:     General: No focal deficit present.     Mental Status: She is alert and oriented to person, place, and time.  Psychiatric:        Mood and Affect: Mood normal.        Behavior: Behavior normal.        Judgment: Judgment normal.      No results found for any visits on 03/18/23.    The 10-year ASCVD risk score (Arnett DK, et al., 2019) is: 6.9%    Assessment & Plan:   Problem List Items Addressed This Visit       Other   Obesity - Primary    Chronic Lifestyle modification alone has not resulted in significant or sustained weight loss We discussed different treatment options and patient would like to proceed with weekly Wegovy injection. Risks and benefits of Wegovy discussed with patient. Prescription sent to pharmacy, patient to follow-up in 1 month to determine how she is tolerating medication      Relevant Medications   Semaglutide-Weight Management (WEGOVY) 0.25 MG/0.5ML SOAJ  Semaglutide-Weight Management (WEGOVY) 0.5 MG/0.5ML SOAJ   Other Relevant Orders   CBC   Splenic lesion    Chronic I recommend the patient undergo imaging however she would prefer to hold off on this at this time.  Check CBC today, if any changes noted will discuss imaging further with patient       Return in about 6 weeks (around 04/29/2023) for F/U with [facility] [place].    Elenore Paddy, NP

## 2023-03-18 NOTE — Assessment & Plan Note (Signed)
Chronic Lifestyle modification alone has not resulted in significant or sustained weight loss We discussed different treatment options and patient would like to proceed with weekly Wegovy injection. Risks and benefits of Wegovy discussed with patient. Prescription sent to pharmacy, patient to follow-up in 1 month to determine how she is tolerating medication

## 2023-03-18 NOTE — Assessment & Plan Note (Signed)
Chronic I recommend the patient undergo imaging however she would prefer to hold off on this at this time.  Check CBC today, if any changes noted will discuss imaging further with patient

## 2023-03-18 NOTE — Telephone Encounter (Signed)
Please start prior auth for wegovy for obesity. Starting BMI 30.37

## 2023-03-19 ENCOUNTER — Other Ambulatory Visit (HOSPITAL_COMMUNITY): Payer: Self-pay

## 2023-03-21 ENCOUNTER — Encounter: Payer: Self-pay | Admitting: Nurse Practitioner

## 2023-03-21 ENCOUNTER — Other Ambulatory Visit (HOSPITAL_COMMUNITY): Payer: Self-pay

## 2023-03-21 NOTE — Telephone Encounter (Signed)
Pt PA send to plan, waiting for approval  Key: [id number]

## 2023-03-22 ENCOUNTER — Encounter: Payer: Self-pay | Admitting: Nurse Practitioner

## 2023-03-22 NOTE — Telephone Encounter (Signed)
Submitted w/(Key: [id number]). Rec'd msg Rosann Auerbach is unable to respond with clinical questions. Please contact [employer] at [phone]. Called [employer] she states PA took so we are waiting on insurance response.Marland KitchenRaechel Chute

## 2023-03-23 ENCOUNTER — Other Ambulatory Visit (HOSPITAL_COMMUNITY): Payer: Self-pay

## 2023-03-23 NOTE — Telephone Encounter (Signed)
Rec'd determination from insurance med was Your prior authorization for Reginal Lutes has been approved! Notified pt with status via mychart.Marland KitchenRaechel Chute

## 2023-04-11 ENCOUNTER — Other Ambulatory Visit (HOSPITAL_COMMUNITY): Payer: Self-pay

## 2023-04-14 ENCOUNTER — Other Ambulatory Visit (HOSPITAL_COMMUNITY): Payer: Self-pay

## 2023-04-20 LAB — NMR, LIPOPROFILE
Cholesterol, Total: 282 mg/dL — ABNORMAL HIGH (ref 100–199)
HDL Particle Number: 34.2 umol/L (ref >=30.5)
HDL-C: 56 mg/dL (ref >39)
LDL Particle Number: 2666 nmol/L — ABNORMAL HIGH (ref <1000)
LDL Size: 21.2 nm (ref >20.5)
LDL-C (NIH Calc): 203 mg/dL — ABNORMAL HIGH (ref 0–99)
LP-IR Score: 47 — ABNORMAL HIGH (ref <=45)
Small LDL Particle Number: 1201 nmol/L — ABNORMAL HIGH (ref <=527)
Triglycerides: 127 mg/dL (ref 0–149)

## 2023-04-20 LAB — COMPREHENSIVE METABOLIC PANEL
ALT: 21 IU/L (ref 0–32)
AST: 17 IU/L (ref 0–40)
Albumin/Globulin Ratio: 1.4 (ref 1.2–2.2)
Albumin: 4.4 g/dL (ref 3.8–4.9)
Alkaline Phosphatase: 105 IU/L (ref 44–121)
BUN/Creatinine Ratio: 10 (ref 9–23)
BUN: 10 mg/dL (ref 6–24)
Bilirubin Total: 0.2 mg/dL (ref 0.0–1.2)
CO2: 22 mmol/L (ref 20–29)
Calcium: 9.5 mg/dL (ref 8.7–10.2)
Chloride: 104 mmol/L (ref 96–106)
Creatinine, Ser: 0.96 mg/dL (ref 0.57–1.00)
Globulin, Total: 3.2 g/dL (ref 1.5–4.5)
Glucose: 89 mg/dL (ref 70–99)
Potassium: 5.2 mmol/L (ref 3.5–5.2)
Sodium: 140 mmol/L (ref 134–144)
Total Protein: 7.6 g/dL (ref 6.0–8.5)
eGFR: 71 mL/min/{1.73_m2} (ref >59)

## 2023-04-20 LAB — LIPOPROTEIN A (LPA): Lipoprotein (a): 365.4 nmol/L — ABNORMAL HIGH (ref <75.0)

## 2023-04-26 ENCOUNTER — Encounter (HOSPITAL_BASED_OUTPATIENT_CLINIC_OR_DEPARTMENT_OTHER): Payer: Self-pay | Admitting: Internal Medicine

## 2023-04-26 ENCOUNTER — Ambulatory Visit (HOSPITAL_BASED_OUTPATIENT_CLINIC_OR_DEPARTMENT_OTHER): Payer: Managed Care, Other (non HMO) | Admitting: Internal Medicine

## 2023-04-26 VITALS — BP 138/95 | HR 107 | Ht 65.0 in | Wt 173.8 lb

## 2023-04-26 DIAGNOSIS — E7800 Pure hypercholesterolemia, unspecified: Secondary | ICD-10-CM

## 2023-04-26 DIAGNOSIS — E7841 Elevated Lipoprotein(a): Secondary | ICD-10-CM

## 2023-04-26 DIAGNOSIS — I700 Atherosclerosis of aorta: Secondary | ICD-10-CM | POA: Diagnosis not present

## 2023-04-26 DIAGNOSIS — R000 Tachycardia, unspecified: Secondary | ICD-10-CM | POA: Diagnosis not present

## 2023-04-26 NOTE — Progress Notes (Signed)
LIPID CLINIC CONSULT NOTE  Chief Complaint:  Follow-up dyslipidemia  Primary Care Physician: Elenore Paddy, NP  Primary Cardiologist:  None  HPI:  Candice King is a 52 y.o. female who is being seen today for the evaluation of dyslipidemia at the request of Elenore Paddy, NP.  This is a pleasant 52 year old female kindly referred for evaluation management of dyslipidemia.  She lives in [place] but works for the [employer].  She previously had care through [facility] and then recently was seen by [facility].  She has requested transitioning her care here to [facility] and has plan to schedule a new primary care provider with Dr. Ihor Dow.  She does have a history of high cholesterol in the past and more recently was noted that her total cholesterol was 318, triglycerides 135, HDL 67 and LDL 224.  She was previously on atorvastatin for period of time which she said she tolerated but the medicine simply ran out and she did not follow-up.  She was also on lisinopril for hypertension but that medicine was not continued as well.  Blood pressure at her recent office visit was noted to be high and again blood pressure today is elevated at 136/98.  I repeated it personally and it was 140/98.  Hypertension runs in both parents as well as dyslipidemia and she noted a brother who died in his 83s of heart disease.  She did have some mental imaging in September 2023 of the abdomen pelvis which was a CT scan.  This showed aortic atherosclerosis.  04/26/2023  Candice King returns today for follow-up.  She has had some improvement in her lipids on 20 mg of atorvastatin although I have prescribed 40 mg daily.  She did call in with symptoms of what sounded like orthostatic hypotension.  Her diastolic blood pressure was as low as in the 50s and I advised her to cut her telmisartan back from 40 to 20 mg daily.  She may have mistakenly cut back her atorvastatin as well.  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  Subsequent LP(a) blood testing does indicate a significantly elevated LP(a) at 365.4 nmol/L.  Her LDL has come down to 203 from greater than 240 with a particle number of 2666 and a small LDL particle number of 1201.  She will need additional therapy and at this point is a good candidate for PCSK9 inhibitor therapy.  Today was also noted that she was tachycardic.  An EKG was performed lying, sitting and standing which showed a stepwise increase in heart rate.  Subsequently orthostatic vitals were obtained.  This demonstrated a supine BP of 140/85, sitting 133/92, standing 133/85 and after several minutes of standing 128/85.  Although there was a drop in blood pressure it was not significant for orthostasis, but I suspect she may be having these episodes.  That would make sense as to why she felt dizzy and has episodes that were somewhat worse on higher dose blood pressure medicine.  She reports her daughter had an episode as well where she passed out and was attributed to drop in blood pressure with vigorous exercise.  Question whether this could be underlying postural orthostasis/tachycardia syndrome.  PMHx:  Past Medical History:  Diagnosis Date   Hyperlipidemia    Hypertension     Past Surgical History:  Procedure Laterality Date   ABDOMINAL HYSTERECTOMY     ABDOMINAL SURGERY  CESAREAN SECTION     LAPAROSCOPIC LYSIS OF ADHESIONS N/A 02/14/2018   Procedure: LAPAROSCOPIC LYSIS OF ADHESIONS;  Surgeon: Romualdo Bolk, MD;  Location: [facility];  Service: Gynecology;  Laterality: N/A;   LAPAROSCOPIC UNILATERAL SALPINGECTOMY Bilateral 02/14/2018   Procedure: LAPAROSCOPIC SALPINGECTOMY;  Surgeon: Romualdo Bolk, MD;  Location: [facility];  Service: Gynecology;  Laterality: Bilateral;   TUBAL LIGATION      FAMHx:  Family History  Problem Relation Age of Onset   Thyroid disease Mother    Heart  disease Maternal Grandmother    Diabetes Maternal Grandmother    Diabetes Paternal Grandmother     SOCHx:   reports that she has been smoking cigarettes. She has a 10.00 pack-year smoking history. She has never used smokeless tobacco. She reports current alcohol use of about 2.0 standard drinks of alcohol per week. She reports that she does not use drugs.  ALLERGIES:  No Known Allergies  ROS: Pertinent items noted in HPI and remainder of comprehensive ROS otherwise negative.  HOME MEDS: Current Outpatient Medications on File Prior to Visit  Medication Sig Dispense Refill   atorvastatin (LIPITOR) 40 MG tablet Take 1 tablet (40 mg total) by mouth daily. 90 tablet 3   Biotin w/ Vitamins C & E (HAIR SKIN & NAILS GUMMIES PO) Take 2 each by mouth daily.     ibuprofen (ADVIL,MOTRIN) 800 MG tablet Take 800 mg by mouth every 8 (eight) hours as needed for headache or moderate pain.     Semaglutide-Weight Management (WEGOVY) 0.25 MG/0.5ML SOAJ Inject 0.25 mg into the skin once a week. START THIS DOSE FIRST 2 mL 0   Semaglutide-Weight Management (WEGOVY) 0.5 MG/0.5ML SOAJ Inject 0.5 mg into the skin once a week. 2 mL 1   telmisartan (MICARDIS) 40 MG tablet Take 20 mg by mouth daily.     No current facility-administered medications on file prior to visit.    LABS/IMAGING: No results found for this or any previous visit (from the past 48 hour(s)). No results found.  LIPID PANEL:    Component Value Date/Time   CHOL 318 (H) 07/21/2022 1115   TRIG 135.0 07/21/2022 1115   HDL 67.60 07/21/2022 1115   CHOLHDL 5 07/21/2022 1115   VLDL 27.0 07/21/2022 1115   LDLCALC 224 (H) 07/21/2022 1115    WEIGHTS: Wt Readings from Last 3 Encounters:  04/26/23 173 lb 12.8 oz (78.8 kg)  03/18/23 182 lb 8 oz (82.8 kg)  02/04/23 186 lb (84.4 kg)    VITALS: BP (!) 138/95 (BP Location: Right Arm, Patient Position: Sitting, Cuff Size: Normal)   Pulse (!) 107   Ht 5\' 5"  (1.651 m)   Wt 173 lb 12.8 oz (78.8  kg)   SpO2 96%   BMI 28.92 kg/m   EXAM: General appearance: alert and no distress Neck: no carotid bruit, no JVD, and thyroid not enlarged, symmetric, no tenderness/mass/nodules Lungs: clear to auscultation bilaterally Heart: regular rate and rhythm, S1, S2 normal, no murmur, click, rub or gallop Abdomen: soft, non-tender; bowel sounds normal; no masses,  no organomegaly Extremities: extremities normal, atraumatic, no cyanosis or edema Pulses: 2+ and symmetric Skin: Skin color, texture, turgor normal. 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   Candice King has a genetic dyslipidemia although was not  noted to have any pathogenic variants.  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  She has had a less than expected response to statin therapy but has been taking only 20 mg instead of 40 mg atorvastatin.  She will increase this but I suspect there will be little additional benefit because of the high LP(a).  She needs additional therapy to reach a target LDL of 70 if possible.  Would advise adding PCSK9 inhibitor therapy.  Plan follow-up with me and repeat lipid NMR and LP(a) in about 3 to 4 months.  With regards to possible postural hypotension and orthostasis, would advise if she becomes symptomatic to consider increasing sodium or fluid intake on those days and perhaps she could benefit from lower extremity compression stockings.  Chrystie Nose, MD, [facility], FACP  [place]  [facility]  Medical Director of the [facility]  [facility] Diplomate of the American Board of  Clinical Lipidology Attending Cardiologist  Direct Dial: [phone]  Fax: [phone]  Website:  [email or web address] Nicely Charlena Haub 04/26/2023, 3:45 PM

## 2023-04-26 NOTE — Patient Instructions (Signed)
Medication Instructions:  Dr. Rennis Golden recommends Repatha Sureclick OR Praluent (PCSK9). This is an injectable cholesterol medication self-administered once every 14 days. This medication will likely need prior approval with your insurance company, which we will work on. If the medication is not approved initially, we may need to do an appeal with your insurance.   Administer medication in area of fatty tissue such as abdomen, outer thigh, back of upper arm - and rotate site with each injection Store medication in refrigerator until ready to administer - allow to sit at room temp for 30 mins - 1 hour prior to injection Dispose of medication in a SHARPS container - your pharmacy should be able to direct you on this and proper disposal   If you need a co-pay card for Repatha: [email or web address].fr If you need a co-pay card for Praluent: [email or web address]  Patient Assistance:    These foundations have funds at various times.   The [employer]: https://www.panfoundation.org/disease-funds/hypercholesterolemia/ -- can sign up for wait list  The [facility] offers assistance to help pay for medication copays.  They will cover copays for all cholesterol lowering meds, including statins, fibrates, omega-3 fish oils like Vascepa, ezetimibe, Repatha, Praluent, Nexletol, Nexlizet.  The cards are usually good for $2,500 or 12 months, whichever comes first. Our fax # is [phone] (you will need this to apply) Go to healthwellfoundation.org Click on "Apply Now" Answer questions as to whom is applying (patient or representative) Your disease fund will be "hypercholesterolemia - Medicare access" They will ask questions about finances and which medications you are taking for cholesterol When you submit, the approval is usually within minutes.  You will need to print the card information from the site You will need to show this information to your pharmacy,  they will bill your Medicare Part D plan first -then bill Health Well --for the copay.   You can also call them at [phone], although the hold times can be quite long.     *If you need a refill on your cardiac medications before your next appointment, please call your pharmacy*   Lab Work: FASTING lab work in 3-4 months to check cholesterol   If you have labs (blood work) drawn today and your tests are completely normal, you will receive your results only by: MyChart Message (if you have MyChart) OR A paper copy in the mail If you have any lab test that is abnormal or we need to change your treatment, we will call you to review the results.   Follow-Up: At [facility], you and your health needs are our priority.  As part of our continuing mission to provide you with exceptional heart care, we have created designated Provider Care Teams.  These Care Teams include your primary Cardiologist (physician) and Advanced Practice Providers (APPs -  Physician Assistants and Nurse Practitioners) who all work together to provide you with the care you need, when you need it.  We recommend signing up for the patient portal called "MyChart".  Sign up information is provided on this After Visit Summary.  MyChart is used to connect with patients for Virtual Visits (Telemedicine).  Patients are able to view lab/test results, encounter notes, upcoming appointments, etc.  Non-urgent messages can be sent to your provider as well.   To learn more about what you can do with MyChart, go to [email or web address].    Your next appointment:    3-4 months with Dr. Rennis Golden

## 2023-04-28 ENCOUNTER — Other Ambulatory Visit (HOSPITAL_COMMUNITY): Payer: Self-pay

## 2023-04-28 ENCOUNTER — Telehealth: Payer: Self-pay

## 2023-04-28 MED ORDER — REPATHA SURECLICK 140 MG/ML ~~LOC~~ SOAJ
1.0000 mL | SUBCUTANEOUS | 3 refills | Status: DC
  Filled 2023-04-28: qty 6, 84d supply, fill #0
  Filled 2023-07-21: qty 6, 84d supply, fill #1
  Filled 2023-10-09: qty 6, 84d supply, fill #2
  Filled 2024-01-01: qty 6, 84d supply, fill #3

## 2023-04-28 NOTE — Telephone Encounter (Signed)
Rx(s) sent to pharmacy electronically.  

## 2023-04-28 NOTE — Telephone Encounter (Signed)
Pharmacy Patient Advocate Encounter   Received notification from RN that prior authorization for REPATHA 140MG /ML is required/requested.   PA submitted to [employer] via CoverMyMeds Key # [id number]   Status is pending

## 2023-04-28 NOTE — Telephone Encounter (Signed)
Pharmacy Patient Advocate Encounter  Prior Authorization for REPATHA 140MG /ML has been APPROVED by [employer] from 6.6.24 to 6.6.25.

## 2023-04-28 NOTE — Telephone Encounter (Signed)
Patient aware of med approved. Waiting on reply re: preferred pharmacy

## 2023-04-29 ENCOUNTER — Ambulatory Visit: Payer: Managed Care, Other (non HMO) | Admitting: Nurse Practitioner

## 2023-04-29 ENCOUNTER — Other Ambulatory Visit (HOSPITAL_COMMUNITY): Payer: Self-pay

## 2023-04-29 VITALS — BP 120/86 | HR 93 | Temp 98.3°F | Ht 65.0 in | Wt 176.0 lb

## 2023-04-29 DIAGNOSIS — Z6830 Body mass index (BMI) 30.0-30.9, adult: Secondary | ICD-10-CM | POA: Diagnosis not present

## 2023-04-29 DIAGNOSIS — E669 Obesity, unspecified: Secondary | ICD-10-CM | POA: Diagnosis not present

## 2023-04-29 MED ORDER — WEGOVY 1 MG/0.5ML ~~LOC~~ SOAJ
1.0000 mg | SUBCUTANEOUS | 1 refills | Status: DC
Start: 2023-04-29 — End: 2023-06-03
  Filled 2023-04-29: qty 2, 28d supply, fill #0

## 2023-04-29 NOTE — Assessment & Plan Note (Signed)
Chronic So far patient has experienced 3% weight loss.  Tolerating medication well. Per shared decision making after completing 4 doses of 0.5 mg weekly injection of Wegovy we will go up to 1.0 mg weekly injection of Wegovy.  Prescription sent to pharmacy today. Patient to follow-up with me again in 1 month, or sooner as needed.

## 2023-04-29 NOTE — Progress Notes (Signed)
   Established Patient Office Visit  Subjective   Patient ID: Candice King, female    DOB: 18-Sep-1971  Age: 52 y.o. MRN: [id number]  Chief Complaint  Patient presents with   Follow-up    6 week follow up no concerns   Obesity    Patient arrives today to discuss obesity Started [facility] about 5 weeks ago.  Has completed 4 doses of the 0.25mg  weekly injection and has completed 1 dose of the 0.5 mg weekly injection.  So far tolerating medication very well.  Denies any nausea, vomiting, constipation, abdominal pain, masses or swelling of the neck.  Has not noticed much change in appetite but has lost approximately 6 pounds based on our scale since she started the [facility].  Starting BMI 30.37, 182lbs on 03/18/2023 Current BMI 29.29, 176lbs     ROS; see HPI    Objective:     BP 120/86 (BP Location: Left Arm, Patient Position: Sitting, Cuff Size: Normal)   Pulse 93   Temp 98.3 F (36.8 C) (Oral)   Ht 5\' 5"  (1.651 m)   Wt 176 lb (79.8 kg)   SpO2 96%   BMI 29.29 kg/m  BP Readings from Last 3 Encounters:  04/29/23 120/86  04/26/23 (!) 138/95  03/18/23 118/74   Wt Readings from Last 3 Encounters:  04/29/23 176 lb (79.8 kg)  04/26/23 173 lb 12.8 oz (78.8 kg)  03/18/23 182 lb 8 oz (82.8 kg)      Physical Exam Vitals reviewed.  Constitutional:      General: She is not in acute distress.    Appearance: Normal appearance.  HENT:     Head: Normocephalic and atraumatic.  Neck:     Thyroid: No thyroid mass, thyromegaly or thyroid tenderness.     Vascular: No carotid bruit.  Cardiovascular:     Rate and Rhythm: Normal rate and regular rhythm.     Pulses: Normal pulses.     Heart sounds: Normal heart sounds.  Pulmonary:     Effort: Pulmonary effort is normal.     Breath sounds: Normal breath sounds.  Skin:    General: Skin is warm and dry.  Neurological:     General: No focal deficit present.     Mental Status: She is alert and oriented to person, place, and time.   Psychiatric:        Mood and Affect: Mood normal.        Behavior: Behavior normal.        Judgment: Judgment normal.      No results found for any visits on 04/29/23.    The 10-year ASCVD risk score (Arnett DK, et al., 2019) is: 6.5%    Assessment & Plan:   Problem List Items Addressed This Visit       Other   Obesity - Primary    Chronic So far patient has experienced 3% weight loss.  Tolerating medication well. Per shared decision making after completing 4 doses of 0.5 mg weekly injection of Wegovy we will go up to 1.0 mg weekly injection of Wegovy.  Prescription sent to pharmacy today. Patient to follow-up with me again in 1 month, or sooner as needed.      Relevant Medications   Semaglutide-Weight Management (WEGOVY) 1 MG/0.5ML SOAJ    Return in about 1 month (around 05/29/2023) for virtual F/U with Maralyn Sago.    Elenore Paddy, NP

## 2023-04-30 ENCOUNTER — Other Ambulatory Visit (HOSPITAL_COMMUNITY): Payer: Self-pay

## 2023-05-03 ENCOUNTER — Other Ambulatory Visit (HOSPITAL_COMMUNITY): Payer: Self-pay

## 2023-05-17 ENCOUNTER — Encounter: Payer: Self-pay | Admitting: Nurse Practitioner

## 2023-05-18 ENCOUNTER — Telehealth: Payer: Self-pay

## 2023-05-18 ENCOUNTER — Other Ambulatory Visit (HOSPITAL_COMMUNITY): Payer: Self-pay

## 2023-05-18 ENCOUNTER — Other Ambulatory Visit: Payer: Self-pay

## 2023-05-18 ENCOUNTER — Encounter: Payer: Self-pay | Admitting: Nurse Practitioner

## 2023-05-18 NOTE — Telephone Encounter (Signed)
Pharmacy Patient Advocate Encounter   Received notification that prior authorization for Wegovy 1mg /0.14ml is required/requested.   Per test claim, refill too soon. Copay is $24.99 with [facility].  Prior authorization not submitted.

## 2023-05-18 NOTE — Telephone Encounter (Signed)
Pt need PA for [facility]

## 2023-05-18 NOTE — Telephone Encounter (Signed)
Pt is needing PA on Wegovy.Marland KitchenRaechel Chute

## 2023-05-19 NOTE — Telephone Encounter (Signed)
My chart message is send to pt about medication being ready to be picked up at her pharmacy with co-payment due.

## 2023-05-20 ENCOUNTER — Other Ambulatory Visit (HOSPITAL_COMMUNITY): Payer: Self-pay

## 2023-06-03 ENCOUNTER — Encounter: Payer: Self-pay | Admitting: Nurse Practitioner

## 2023-06-03 ENCOUNTER — Telehealth (INDEPENDENT_AMBULATORY_CARE_PROVIDER_SITE_OTHER): Payer: Managed Care, Other (non HMO) | Admitting: Nurse Practitioner

## 2023-06-03 ENCOUNTER — Other Ambulatory Visit (HOSPITAL_COMMUNITY): Payer: Self-pay

## 2023-06-03 VITALS — Ht 66.0 in | Wt 162.2 lb

## 2023-06-03 DIAGNOSIS — E669 Obesity, unspecified: Secondary | ICD-10-CM

## 2023-06-03 DIAGNOSIS — Z6830 Body mass index (BMI) 30.0-30.9, adult: Secondary | ICD-10-CM | POA: Diagnosis not present

## 2023-06-03 MED ORDER — WEGOVY 1.7 MG/0.75ML ~~LOC~~ SOAJ
1.7000 mg | SUBCUTANEOUS | 1 refills | Status: DC
Start: 2023-06-03 — End: 2023-07-01
  Filled 2023-06-03: qty 3, 28d supply, fill #0

## 2023-06-03 NOTE — Progress Notes (Signed)
   Established Patient Office Visit  An audio/visual tele-health visit was completed today for this patient. I connected with  Candice King on 06/03/23 utilizing audio/visual technology and verified that I am speaking with the correct person using two identifiers. The patient was located at their place of employment, and I was located at the office of [facility] at [facility] during the encounter. I discussed the limitations of evaluation and management by telemedicine. The patient expressed understanding and agreed to proceed.    Subjective   Patient ID: Candice King, female    DOB: October 05, 1971  Age: 52 y.o. MRN: [id number]  Chief Complaint  Patient presents with   Obesity   Patient is currently on Wegovy 1.0 mg/week.  Tolerating well.  No nausea, vomiting, abdominal pain, masses or swelling of throat. Starting BMI 30.37, 182lbs on 03/18/2023 04/2023: BMI 29.29, 176lbs  Current: BMI 26.99, 162 pounds ~11% weight loss thus far      ROS: see HPI    Objective:     Ht 5\' 6"  (1.676 m)   Wt 162 lb 3.2 oz (73.6 kg)   BMI 26.18 kg/m    Physical Exam Comprehensive physical exam not completed today as office visit was conducted remotely.  Patient appears well on video.  Patient was alert and oriented, and appeared to have appropriate judgment.   No results found for any visits on 06/03/23.    The 10-year ASCVD risk score (Arnett DK, et al., 2019) is: 6.5%    Assessment & Plan:   Problem List Items Addressed This Visit       Other   Obesity - Primary    Starting BMI 30.37, starting weight 182lbs Current BMI: 26.99, current weight 162lbs Has lost ~11% weight Patient tolerating. Increase Wegovy to 1.7 mg/week injection.  Patient to follow back up in 4 weeks to monitor how she is tolerating medication.      Relevant Medications   Semaglutide-Weight Management (WEGOVY) 1.7 MG/0.75ML SOAJ    Return in about 4 weeks (around 07/01/2023) for F/U with Candice King.     Candice Paddy, NP

## 2023-06-03 NOTE — Assessment & Plan Note (Addendum)
Starting BMI 30.37, starting weight 182lbs Current BMI: 26.99, current weight 162lbs Has lost ~11% weight Patient tolerating. Increase Wegovy to 1.7 mg/week injection.  Patient to follow back up in 4 weeks to monitor how she is tolerating medication.

## 2023-06-09 ENCOUNTER — Other Ambulatory Visit (HOSPITAL_COMMUNITY): Payer: Self-pay

## 2023-06-10 ENCOUNTER — Other Ambulatory Visit (HOSPITAL_COMMUNITY): Payer: Self-pay

## 2023-06-13 ENCOUNTER — Other Ambulatory Visit (HOSPITAL_COMMUNITY): Payer: Self-pay

## 2023-07-01 ENCOUNTER — Telehealth (INDEPENDENT_AMBULATORY_CARE_PROVIDER_SITE_OTHER): Payer: Managed Care, Other (non HMO) | Admitting: Nurse Practitioner

## 2023-07-01 ENCOUNTER — Other Ambulatory Visit (HOSPITAL_COMMUNITY): Payer: Self-pay

## 2023-07-01 VITALS — Ht 66.0 in | Wt 153.6 lb

## 2023-07-01 DIAGNOSIS — Z6830 Body mass index (BMI) 30.0-30.9, adult: Secondary | ICD-10-CM | POA: Diagnosis not present

## 2023-07-01 DIAGNOSIS — E669 Obesity, unspecified: Secondary | ICD-10-CM

## 2023-07-01 MED ORDER — WEGOVY 2.4 MG/0.75ML ~~LOC~~ SOAJ
2.4000 mg | SUBCUTANEOUS | 2 refills | Status: DC
Start: 2023-07-01 — End: 2024-03-26
  Filled 2023-07-01: qty 3, 28d supply, fill #0
  Filled 2023-08-09: qty 3, 28d supply, fill #1
  Filled 2023-09-02: qty 3, 28d supply, fill #2
  Filled 2023-10-02: qty 3, 28d supply, fill #3
  Filled 2023-10-28 – 2023-11-09 (×2): qty 3, 28d supply, fill #4
  Filled 2023-12-02: qty 3, 28d supply, fill #5
  Filled 2023-12-30: qty 3, 28d supply, fill #6
  Filled 2024-01-27: qty 3, 28d supply, fill #7
  Filled 2024-02-22: qty 3, 28d supply, fill #8

## 2023-07-01 NOTE — Progress Notes (Signed)
   Established Patient Office Visit  An audio/visual tele-health visit was completed today for this patient. I connected with  Candice King on 07/01/23 utilizing audio/visual technology and verified that I am speaking with the correct person using two identifiers. The patient was located at their home, and I was located at the office of [facility] at [facility] during the encounter. I discussed the limitations of evaluation and management by telemedicine. The patient expressed understanding and agreed to proceed.    Subjective   Patient ID: Candice King, female    DOB: September 19, 1971  Age: 52 y.o. MRN: [id number]  Chief Complaint  Patient presents with   Obesity    Patient has virtually for the above.  She continues on Wegovy 1.7 mg/week.  She says she is tolerating medication well.  Initially started treatment in April 2024, at that time starting BMI was 30.37 and starting weight was 182 pounds.  Today she reports her at home scale weight is 153 pounds, making her current BMI 24.79. She denies nausea, vomiting, abdominal pain, masses to neck.    ROS: see HPI    Objective:     Ht 5\' 6"  (1.676 m)   Wt 153 lb 9.6 oz (69.7 kg)   BMI 24.79 kg/m  BP Readings from Last 3 Encounters:  04/29/23 120/86  04/26/23 (!) 138/95  03/18/23 118/74   Wt Readings from Last 3 Encounters:  07/01/23 153 lb 9.6 oz (69.7 kg)  06/03/23 162 lb 3.2 oz (73.6 kg)  04/29/23 176 lb (79.8 kg)      Physical Exam Comprehensive physical exam not completed today as office visit was conducted remotely.  Patient appears well over video.  Patient was alert and oriented, and appeared to have appropriate judgment.   No results found for any visits on 07/01/23.    The 10-year ASCVD risk score (Arnett DK, et al., 2019) is: 6.5%    Assessment & Plan:   Problem List Items Addressed This Visit       Other   Obesity - Primary    Chronic, starting BMI 30.37, starting weight 102 pounds Current  BMI 24.79, current weight 153 pounds.  Indicating approximately 16% weight loss thus far. Will increase Wegovy to 2.4 mg/week. Patient has annual physical scheduled in 1 month, she will follow-up at that time.  Encouraged her to let me know if she experience any negative side effects related to the [facility] dosage change.  Patient reports understanding.      Relevant Medications   Semaglutide-Weight Management (WEGOVY) 2.4 MG/0.75ML SOAJ    Return in about 1 month (around 08/01/2023) for F/U with Alese Furniss.    Elenore Paddy, NP

## 2023-07-01 NOTE — Assessment & Plan Note (Signed)
Chronic, starting BMI 30.37, starting weight 102 pounds Current BMI 24.79, current weight 153 pounds.  Indicating approximately 16% weight loss thus far. Will increase Wegovy to 2.4 mg/week. Patient has annual physical scheduled in 1 month, she will follow-up at that time.  Encouraged her to let me know if she experience any negative side effects related to the [facility] dosage change.  Patient reports understanding.

## 2023-07-07 ENCOUNTER — Encounter (INDEPENDENT_AMBULATORY_CARE_PROVIDER_SITE_OTHER): Payer: Self-pay

## 2023-07-09 ENCOUNTER — Other Ambulatory Visit (HOSPITAL_COMMUNITY): Payer: Self-pay

## 2023-07-11 ENCOUNTER — Other Ambulatory Visit: Payer: Self-pay | Admitting: Oncology

## 2023-07-11 DIAGNOSIS — Z006 Encounter for examination for normal comparison and control in clinical research program: Secondary | ICD-10-CM

## 2023-07-21 ENCOUNTER — Other Ambulatory Visit (HOSPITAL_COMMUNITY): Payer: Self-pay

## 2023-07-29 ENCOUNTER — Ambulatory Visit (INDEPENDENT_AMBULATORY_CARE_PROVIDER_SITE_OTHER): Payer: Managed Care, Other (non HMO) | Admitting: Nurse Practitioner

## 2023-07-29 VITALS — BP 120/76 | HR 92 | Temp 97.7°F | Ht 66.0 in | Wt 150.0 lb

## 2023-07-29 DIAGNOSIS — E669 Obesity, unspecified: Secondary | ICD-10-CM | POA: Diagnosis not present

## 2023-07-29 DIAGNOSIS — G4700 Insomnia, unspecified: Secondary | ICD-10-CM | POA: Diagnosis not present

## 2023-07-29 DIAGNOSIS — Z0000 Encounter for general adult medical examination without abnormal findings: Secondary | ICD-10-CM | POA: Diagnosis not present

## 2023-07-29 DIAGNOSIS — Z1239 Encounter for other screening for malignant neoplasm of breast: Secondary | ICD-10-CM | POA: Diagnosis not present

## 2023-07-29 DIAGNOSIS — Z6830 Body mass index (BMI) 30.0-30.9, adult: Secondary | ICD-10-CM

## 2023-07-29 DIAGNOSIS — Z0001 Encounter for general adult medical examination with abnormal findings: Secondary | ICD-10-CM | POA: Insufficient documentation

## 2023-07-29 NOTE — Assessment & Plan Note (Signed)
Will order screening mammogram, mobile mammogram bus is at facility today so patient will consider stopping there on her way home.  But will also order mammogram breast center in case she changes her mind on her way out.

## 2023-07-29 NOTE — Assessment & Plan Note (Signed)
Chronic Etiology unclear We discussed sleep hygiene recommendations We also discussed pharmacological treatment, for now patient will focus on lifestyle modification and will reach out if she would like to trial medications

## 2023-07-29 NOTE — Assessment & Plan Note (Signed)
Starting BMI 30.37, starting weight 182 pounds Current BMI 24.21, current weight 150 pounds She is tolerating Wegovy well.  Continue on Wegovy 2.4 mg weekly

## 2023-07-29 NOTE — Assessment & Plan Note (Signed)
Patient declined flu shot today.  We did discuss healthy living, preventative screenings, and immunizations.  She reports her understanding.  Handout provided. Will order screening mammogram, mobile mammogram bus is at facility today so patient will consider stopping there on her way home.  But will also order mammogram breast center in case she changes her mind on her way out.

## 2023-07-29 NOTE — Progress Notes (Signed)
Complete physical exam  Patient: Candice King   DOB: 1970-12-09   52 y.o. Female  MRN: [id number]  Subjective:    Chief Complaint  Patient presents with   Annual Exam    Candice King is a 52 y.o. female who presents today for a complete physical exam. She reports consuming a general diet.  Exercise: walk, 4x/week  She generally feels well. She reports sleeping poorly. She does have additional problems to discuss today.   Insomnia: Chronic, reports getting 4 hours of sleep per night.  These hours are interrupted with her waking up.  She denies nocturia, snoring.  She has a hard time both falling asleep as well as staying asleep.  Tries an over-the-counter sleep aid sometimes which helps but usually wears off within a few hours and she is awake.  She has scheduled bedtime and rise time that she adheres to most days of the week.  She does watch TV around bedtime but turns it off before she falls asleep.  Most recent fall risk assessment:    07/29/2023    2:00 PM  Fall Risk   Falls in the past year? 0  Number falls in past yr: 0  Injury with Fall? 0  Risk for fall due to : No Fall Risks  Follow up Falls evaluation completed     Most recent depression screenings:    07/29/2023    2:00 PM 04/29/2023    1:47 PM  PHQ 2/9 Scores  PHQ - 2 Score 1 0    Vision:Within last year and Dental: No current dental problems and Receives regular dental care  Past Medical History:  Diagnosis Date   Hyperlipidemia    Hypertension    Past Surgical History:  Procedure Laterality Date   ABDOMINAL HYSTERECTOMY     ABDOMINAL SURGERY     CESAREAN SECTION     LAPAROSCOPIC LYSIS OF ADHESIONS N/A 02/14/2018   Procedure: LAPAROSCOPIC LYSIS OF ADHESIONS;  Surgeon: Romualdo Bolk, MD;  Location: [facility];  Service: Gynecology;  Laterality: N/A;   LAPAROSCOPIC UNILATERAL SALPINGECTOMY Bilateral 02/14/2018   Procedure: LAPAROSCOPIC SALPINGECTOMY;  Surgeon: Romualdo Bolk, MD;  Location: [facility];  Service: Gynecology;  Laterality: Bilateral;   TUBAL LIGATION     Social History   Socioeconomic History   Marital status: Divorced    Spouse name: Not on file   Number of children: Not on file   Years of education: Not on file   Highest education level: Associate degree: occupational, Scientist, product/process development, or vocational program  Occupational History   Not on file  Tobacco Use   Smoking status: Some Days    Current packs/day: 0.50    Average packs/day: 0.5 packs/day for 20.0 years (10.0 ttl pk-yrs)    Types: Cigarettes   Smokeless tobacco: Never  Vaping Use   Vaping status: Never Used  Substance and Sexual Activity   Alcohol use: Yes    Alcohol/week: 2.0 standard drinks of alcohol    Types: 2 Standard drinks or equivalent per week    Comment: occasional   Drug use: No   Sexual activity: Yes    Partners: Male    Birth control/protection: Surgical    Comment: hysterectomy   Other Topics Concern   Not on file  Social History Narrative   Not on file   Social Determinants of Health   Financial Resource Strain: Low Risk  (03/15/2023)   Overall Financial Resource Strain (CARDIA)    Difficulty of Paying Living  Expenses: Not hard at all  Food Insecurity: No Food Insecurity (03/15/2023)   Hunger Vital Sign    Worried About Running Out of Food in the Last Year: Never true    Ran Out of Food in the Last Year: Never true  Transportation Needs: No Transportation Needs (03/15/2023)   PRAPARE - Administrator, Civil Service (Medical): No    Lack of Transportation (Non-Medical): No  Physical Activity: Sufficiently Active (03/15/2023)   Exercise Vital Sign    Days of Exercise per Week: 4 days    Minutes of Exercise per Session: 60 min  Stress: Stress Concern Present (03/15/2023)   [employer] of Occupational Health - Occupational Stress Questionnaire    Feeling of Stress : To some extent  Social Connections: Socially Isolated (03/15/2023)   Social Connection and Isolation  Panel [NHANES]    Frequency of Communication with Friends and Family: More than three times a week    Frequency of Social Gatherings with Friends and Family: Once a week    Attends Religious Services: Never    Database administrator or Organizations: No    Attends Engineer, structural: Not on file    Marital Status: Divorced  Intimate Partner Violence: Unknown (02/22/2022)   Received from [employer], [facility]   HITS    Physically Hurt: Not on file    Insult or Talk Down To: Not on file    Threaten Physical Harm: Not on file    Scream or Curse: Not on file   Family History  Problem Relation Age of Onset   Thyroid disease Mother    Heart disease Maternal Grandmother    Diabetes Maternal Grandmother    Diabetes Paternal Grandmother    No Known Allergies    Patient Care Team: Elenore Paddy, NP as PCP - General (Nurse Practitioner) Karie Soda, MD as Consulting Physician (General Surgery) Charna Elizabeth, MD as Consulting Physician (Gastroenterology)   Outpatient Medications Prior to Visit  Medication Sig   atorvastatin (LIPITOR) 40 MG tablet Take 1 tablet (40 mg total) by mouth daily.   Biotin w/ Vitamins C & E (HAIR SKIN & NAILS GUMMIES PO) Take 2 each by mouth daily.   Evolocumab (REPATHA SURECLICK) 140 MG/ML SOAJ Inject 140 mg into the skin every 14 (fourteen) days.   Semaglutide-Weight Management (WEGOVY) 2.4 MG/0.75ML SOAJ Inject 2.4 mg into the skin once a week.   telmisartan (MICARDIS) 40 MG tablet Take 20 mg by mouth daily.   ibuprofen (ADVIL,MOTRIN) 800 MG tablet Take 800 mg by mouth every 8 (eight) hours as needed for headache or moderate pain. (Patient not taking: Reported on 07/29/2023)   No facility-administered medications prior to visit.    Review of Systems  Constitutional:  Positive for weight loss (intentional). Negative for chills, fever and malaise/fatigue.  HENT:  Negative for hearing loss and tinnitus.   Eyes:  Negative for blurred vision and  double vision.  Respiratory:  Negative for cough, shortness of breath and wheezing.   Cardiovascular:  Negative for chest pain and palpitations.  Gastrointestinal:  Negative for abdominal pain and blood in stool.  Genitourinary:  Negative for dysuria and hematuria.  Skin:  Negative for itching and rash.  Neurological:  Negative for seizures and loss of consciousness.  Psychiatric/Behavioral:  Negative for depression and suicidal ideas. The patient has insomnia.           Objective:     BP 120/76   Pulse 92  Temp 97.7 F (36.5 C) (Temporal)   Ht 5\' 6"  (1.676 m)   Wt 150 lb (68 kg)   SpO2 99%   BMI 24.21 kg/m  BP Readings from Last 3 Encounters:  07/29/23 120/76  04/29/23 120/86  04/26/23 (!) 138/95   Wt Readings from Last 3 Encounters:  07/29/23 150 lb (68 kg)  07/01/23 153 lb 9.6 oz (69.7 kg)  06/03/23 162 lb 3.2 oz (73.6 kg)      Physical Exam Vitals reviewed. Exam conducted with a chaperone present.  Constitutional:      Appearance: Normal appearance.  HENT:     Head: Normocephalic and atraumatic.     Right Ear: Tympanic membrane, ear canal and external ear normal.     Left Ear: Tympanic membrane, ear canal and external ear normal.  Eyes:     General:        Right eye: No discharge.        Left eye: No discharge.     Extraocular Movements: Extraocular movements intact.     Conjunctiva/sclera: Conjunctivae normal.     Pupils: Pupils are equal, round, and reactive to light.  Neck:     Vascular: No carotid bruit.  Cardiovascular:     Rate and Rhythm: Normal rate and regular rhythm.     Pulses: Normal pulses.     Heart sounds: Normal heart sounds. No murmur heard. Pulmonary:     Effort: Pulmonary effort is normal.     Breath sounds: Normal breath sounds.  Chest:  Breasts:    Breasts are symmetrical.     Right: Normal.     Left: Normal.  Abdominal:     General: Abdomen is flat. Bowel sounds are normal. There is no distension.     Palpations: Abdomen  is soft. There is no mass.     Tenderness: There is no abdominal tenderness.  Musculoskeletal:        General: No tenderness.     Cervical back: Neck supple. No muscular tenderness.     Right lower leg: No edema.     Left lower leg: No edema.  Lymphadenopathy:     Cervical: No cervical adenopathy.     Upper Body:     Right upper body: No supraclavicular adenopathy.     Left upper body: No supraclavicular adenopathy.  Skin:    General: Skin is warm and dry.  Neurological:     General: No focal deficit present.     Mental Status: She is alert and oriented to person, place, and time.     Motor: No weakness.     Gait: Gait normal.  Psychiatric:        Mood and Affect: Mood normal.        Behavior: Behavior normal.        Judgment: Judgment normal.      No results found for any visits on 07/29/23.     Assessment & Plan:    Routine Health Maintenance and Physical Exam  Immunization History  Administered Date(s) Administered   PFIZER(Purple Top)SARS-COV-2 Vaccination 01/30/2020, 02/20/2020   Tdap 11/18/2015    Health Maintenance  Topic Date Due   Hepatitis C Screening  Never done   Zoster Vaccines- Shingrix (1 of 2) Never done   COVID-19 Vaccine (3 - Pfizer risk series) 03/19/2020   INFLUENZA VACCINE  02/20/2024 (Originally 06/23/2023)   MAMMOGRAM  04/28/2024 (Originally 02/13/2021)   DTaP/Tdap/Td (2 - Td or Tdap) 11/17/2025   Colonoscopy  09/20/2032  HPV VACCINES  Aged Out   PAP SMEAR-Modifier  Discontinued   HIV Screening  Discontinued    Discussed health benefits of physical activity, and encouraged her to engage in regular exercise appropriate for her age and condition.  Problem List Items Addressed This Visit       Other   Obesity    Starting BMI 30.37, starting weight 182 pounds Current BMI 24.21, current weight 150 pounds She is tolerating Wegovy well.  Continue on Wegovy 2.4 mg weekly      Encounter for general adult medical examination with abnormal  findings - Primary    Patient declined flu shot today.  We did discuss healthy living, preventative screenings, and immunizations.  She reports her understanding.  Handout provided. Will order screening mammogram, mobile mammogram bus is at facility today so patient will consider stopping there on her way home.  But will also order mammogram breast center in case she changes her mind on her way out.      Encounter for screening for malignant neoplasm of breast    Will order screening mammogram, mobile mammogram bus is at facility today so patient will consider stopping there on her way home.  But will also order mammogram breast center in case she changes her mind on her way out.      Relevant Orders   MM 3D SCREENING MAMMOGRAM BILATERAL BREAST   Insomnia    Chronic Etiology unclear We discussed sleep hygiene recommendations We also discussed pharmacological treatment, for now patient will focus on lifestyle modification and will reach out if she would like to trial medications      Return in about 6 months (around 01/26/2024) for F/U with Denni France.  Elenore Paddy, NP

## 2023-08-10 ENCOUNTER — Other Ambulatory Visit (HOSPITAL_COMMUNITY): Payer: Self-pay

## 2023-08-26 ENCOUNTER — Ambulatory Visit (HOSPITAL_BASED_OUTPATIENT_CLINIC_OR_DEPARTMENT_OTHER)
Admission: RE | Admit: 2023-08-26 | Discharge: 2023-08-26 | Disposition: A | Discharge status: Home / Self Care | Payer: Managed Care, Other (non HMO) | Source: Ambulatory Visit | Attending: Nurse Practitioner | Admitting: Nurse Practitioner

## 2023-08-26 DIAGNOSIS — Z1231 Encounter for screening mammogram for malignant neoplasm of breast: Secondary | ICD-10-CM

## 2023-09-05 ENCOUNTER — Ambulatory Visit (HOSPITAL_BASED_OUTPATIENT_CLINIC_OR_DEPARTMENT_OTHER): Payer: Managed Care, Other (non HMO) | Admitting: Internal Medicine

## 2023-09-05 ENCOUNTER — Encounter (HOSPITAL_BASED_OUTPATIENT_CLINIC_OR_DEPARTMENT_OTHER): Payer: Self-pay | Admitting: Internal Medicine

## 2023-09-05 VITALS — BP 122/80 | HR 88 | Ht 66.0 in | Wt 142.0 lb

## 2023-09-05 DIAGNOSIS — I700 Atherosclerosis of aorta: Secondary | ICD-10-CM | POA: Diagnosis not present

## 2023-09-05 DIAGNOSIS — E7800 Pure hypercholesterolemia, unspecified: Secondary | ICD-10-CM

## 2023-09-05 DIAGNOSIS — E7841 Elevated Lipoprotein(a): Secondary | ICD-10-CM

## 2023-09-05 LAB — NMR, LIPOPROFILE
Cholesterol, Total: 168 mg/dL (ref 100–199)
HDL Particle Number: 35.7 umol/L (ref >=30.5)
HDL-C: 60 mg/dL (ref >39)
LDL Particle Number: 1195 nmol/L — ABNORMAL HIGH (ref <1000)
LDL Size: 20.8 nmol (ref >20.5)
LDL-C (NIH Calc): 91 mg/dL (ref 0–99)
LP-IR Score: 35 (ref <=45)
Small LDL Particle Number: 696 nmol/L — ABNORMAL HIGH (ref <=527)
Triglycerides: 95 mg/dL (ref 0–149)

## 2023-09-05 LAB — LIPOPROTEIN A (LPA): Lipoprotein (a): 261.8 nmol/L — ABNORMAL HIGH (ref <75.0)

## 2023-09-05 NOTE — Progress Notes (Signed)
LIPID CLINIC CONSULT NOTE  Chief Complaint:  Follow-up dyslipidemia  Primary Care Physician: Elenore Paddy, NP  Primary Cardiologist:  None  HPI:  Candice King is a 52 y.o. female who is being seen today for the evaluation of dyslipidemia at the request of Elenore Paddy, NP.  This is a pleasant 52 year old female kindly referred for evaluation management of dyslipidemia.  She lives in [place] but works for the [employer].  She previously had care through [facility] and then recently was seen by [facility].  She has requested transitioning her care here to [facility] and has plan to schedule a new primary care provider with Dr. Ihor Dow.  She does have a history of high cholesterol in the past and more recently was noted that her total cholesterol was 318, triglycerides 135, HDL 67 and LDL 224.  She was previously on atorvastatin for period of time which she said she tolerated but the medicine simply ran out and she did not follow-up.  She was also on lisinopril for hypertension but that medicine was not continued as well.  Blood pressure at her recent office visit was noted to be high and again blood pressure today is elevated at 136/98.  I repeated it personally and it was 140/98.  Hypertension runs in both parents as well as dyslipidemia and she noted a brother who died in his 40s of heart disease.  She did have some mental imaging in September 2023 of the abdomen pelvis which was a CT scan.  This showed aortic atherosclerosis.  04/26/2023  Candice King returns today for follow-up.  She has had some improvement in her lipids on 20 mg of atorvastatin although I have prescribed 40 mg daily.  She did call in with symptoms of what sounded like orthostatic hypotension.  Her diastolic blood pressure was as low as in the 50s and I advised her to cut her telmisartan back from 40 to 20 mg daily.  She may have mistakenly cut back her atorvastatin as well.  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  Subsequent LP(a) blood testing does indicate a significantly elevated LP(a) at 365.4 nmol/L.  Her LDL has come down to 203 from greater than 240 with a particle number of 2666 and a small LDL particle number of 1201.  She will need additional therapy and at this point is a good candidate for PCSK9 inhibitor therapy.  Today was also noted that she was tachycardic.  An EKG was performed lying, sitting and standing which showed a stepwise increase in heart rate.  Subsequently orthostatic vitals were obtained.  This demonstrated a supine BP of 140/85, sitting 133/92, standing 133/85 and after several minutes of standing 128/85.  Although there was a drop in blood pressure it was not significant for orthostasis, but I suspect she may be having these episodes.  That would make sense as to why she felt dizzy and has episodes that were somewhat worse on higher dose blood pressure medicine.  She reports her daughter had an episode as well where she passed out and was attributed to drop in blood pressure with vigorous exercise.  Question whether this could be underlying postural orthostasis/tachycardia syndrome.  09/05/2023  Candice King returns today for follow-up.  She has had a remarkable improvement in her cholesterol over the past 6 months.  Her LDL particle number has gone down to 1195 from 2666, LDL is now  91 (down from 203), all other lipid parameters are within limits.  Her LP(a) is also come down substantially from 365 down to 261.  In addition she is lost about 30 pounds and is on Wegovy.  She is exercising daily and has changed her diet substantially.  Her BMI now is only 22.  Her blood pressure is normal.  Overall she feels wonderful.  PMHx:  Past Medical History:  Diagnosis Date   Hyperlipidemia    Hypertension     Past Surgical History:  Procedure Laterality Date   ABDOMINAL HYSTERECTOMY     ABDOMINAL  SURGERY     CESAREAN SECTION     LAPAROSCOPIC LYSIS OF ADHESIONS N/A 02/14/2018   Procedure: LAPAROSCOPIC LYSIS OF ADHESIONS;  Surgeon: Romualdo Bolk, MD;  Location: [facility];  Service: Gynecology;  Laterality: N/A;   LAPAROSCOPIC UNILATERAL SALPINGECTOMY Bilateral 02/14/2018   Procedure: LAPAROSCOPIC SALPINGECTOMY;  Surgeon: Romualdo Bolk, MD;  Location: [facility];  Service: Gynecology;  Laterality: Bilateral;   TUBAL LIGATION      FAMHx:  Family History  Problem Relation Age of Onset   Thyroid disease Mother    Heart disease Maternal Grandmother    Diabetes Maternal Grandmother    Diabetes Paternal Grandmother     SOCHx:   reports that she has been smoking cigarettes. She has a 10 pack-year smoking history. She has never used smokeless tobacco. She reports current alcohol use of about 2.0 standard drinks of alcohol per week. She reports that she does not use drugs.  ALLERGIES:  No Known Allergies  ROS: Pertinent items noted in HPI and remainder of comprehensive ROS otherwise negative.  HOME MEDS: Current Outpatient Medications on File Prior to Visit  Medication Sig Dispense Refill   atorvastatin (LIPITOR) 40 MG tablet Take 1 tablet (40 mg total) by mouth daily. 90 tablet 3   Biotin w/ Vitamins C & E (HAIR SKIN & NAILS GUMMIES PO) Take 2 each by mouth daily.     Evolocumab (REPATHA SURECLICK) 140 MG/ML SOAJ Inject 140 mg into the skin every 14 (fourteen) days. 6 mL 3   ibuprofen (ADVIL,MOTRIN) 800 MG tablet Take 800 mg by mouth every 8 (eight) hours as needed for headache or moderate pain (pain score 4-6).     Semaglutide-Weight Management (WEGOVY) 2.4 MG/0.75ML SOAJ Inject 2.4 mg into the skin once a week. 9 mL 2   telmisartan (MICARDIS) 40 MG tablet Take 20 mg by mouth daily.     No current facility-administered medications on file prior to visit.    LABS/IMAGING: No results found for this or any previous visit (from the past 48 hour(s)). No results found.  LIPID  PANEL:    Component Value Date/Time   CHOL 318 (H) 07/21/2022 1115   TRIG 135.0 07/21/2022 1115   HDL 67.60 07/21/2022 1115   CHOLHDL 5 07/21/2022 1115   VLDL 27.0 07/21/2022 1115   LDLCALC 224 (H) 07/21/2022 1115    WEIGHTS: Wt Readings from Last 3 Encounters:  09/05/23 142 lb (64.4 kg)  07/29/23 150 lb (68 kg)  07/01/23 153 lb 9.6 oz (69.7 kg)    VITALS: BP 122/80 (BP Location: Left Arm, Patient Position: Sitting, Cuff Size: Normal)   Pulse 88   Ht 5\' 6"  (1.676 m)   Wt 142 lb (64.4 kg)   BMI 22.92 kg/m   EXAM: Deferred  EKG: Deferred  ASSESSMENT: Familial hyperlipidemia, LDL greater than 190 Elevated LP(a) at 365.4 nmol/L -> 261 nmol/L Hypertension with possible postural  hypotension Family history of dyslipidemia Aortic atherosclerosis  PLAN: 1.   Candice King has had substantial improvement in her lipids on combination therapy with Repatha and atorvastatin in addition to a 30 pound weight loss which was facilitated with diet and lifestyle as well as QIONGE.  Her LP(a) has improved down to 261 nmol/L.  Although still elevated this is improved over previous values.  Overall I think she is now at much lower risk.  I did discuss the possibility of participating in the longitudinal African heart study which she may qualify for.  Will provide her information on that.  In addition she might be interested in a therapeutic trial to lower LP(a) which hopefully we will have available in the near future.  Plan follow-up with me annually or sooner as necessary.  Chrystie Nose, MD, [facility], FACP    [facility]  Medical Director of the Advanced Lipid Disorders &  Cardiovascular Risk Reduction Clinic Diplomate of the American Board of Clinical Lipidology Attending Cardiologist  Direct Dial: [phone]  Fax: [phone]  Website:  [email or web address] Nicely Edwen Mclester 09/05/2023, 3:42 PM

## 2023-09-05 NOTE — Patient Instructions (Signed)
Medication Instructions:  NO CHANGES  *If you need a refill on your cardiac medications before your next appointment, please call your pharmacy*   Lab Work: FASTING lab work in 1 year to check cholesterol   If you have labs (blood work) drawn today and your tests are completely normal, you will receive your results only by: MyChart Message (if you have MyChart) OR A paper copy in the mail If you have any lab test that is abnormal or we need to change your treatment, we will call you to review the results.   Follow-Up: At [facility], you and your health needs are our priority.  As part of our continuing mission to provide you with exceptional heart care, we have created designated Provider Care Teams.  These Care Teams include your primary Cardiologist (physician) and Advanced Practice Providers (APPs -  Physician Assistants and Nurse Practitioners) who all work together to provide you with the care you need, when you need it.  We recommend signing up for the patient portal called "MyChart".  Sign up information is provided on this After Visit Summary.  MyChart is used to connect with patients for Virtual Visits (Telemedicine).  Patients are able to view lab/test results, encounter notes, upcoming appointments, etc.  Non-urgent messages can be sent to your provider as well.   To learn more about what you can do with MyChart, go to [email or web address].    Your next appointment:   12 months with Dr. Rennis Golden -- lipid clinic

## 2023-09-06 ENCOUNTER — Other Ambulatory Visit (HOSPITAL_COMMUNITY): Payer: Self-pay

## 2023-09-07 ENCOUNTER — Encounter: Payer: Self-pay | Admitting: Nurse Practitioner

## 2023-09-23 ENCOUNTER — Other Ambulatory Visit (HOSPITAL_COMMUNITY): Payer: Managed Care, Other (non HMO)

## 2023-10-28 ENCOUNTER — Other Ambulatory Visit (HOSPITAL_COMMUNITY): Payer: Self-pay

## 2023-11-02 ENCOUNTER — Other Ambulatory Visit: Payer: Self-pay

## 2023-11-02 ENCOUNTER — Encounter: Payer: Self-pay | Admitting: Nurse Practitioner

## 2023-11-02 ENCOUNTER — Other Ambulatory Visit (HOSPITAL_COMMUNITY): Payer: Self-pay

## 2023-11-02 NOTE — Telephone Encounter (Signed)
Patient needs a prior authorization started for Semaglutide-Weight Management (WEGOVY) 2.4 MG/0.75ML SOAJ. She is due for her next injection today.

## 2023-11-04 ENCOUNTER — Telehealth: Payer: Self-pay | Admitting: Nurse Practitioner

## 2023-11-04 NOTE — Telephone Encounter (Signed)
Pharmacy is waiting on Prior authorization and pt is completely out.  Semaglutide-Weight Management (WEGOVY) 2.4 MG/0.75ML SOAJ

## 2023-11-07 ENCOUNTER — Telehealth: Payer: Self-pay

## 2023-11-07 ENCOUNTER — Other Ambulatory Visit (HOSPITAL_COMMUNITY): Payer: Self-pay

## 2023-11-07 NOTE — Telephone Encounter (Signed)
Pharmacy Patient Advocate Encounter   Received notification from Pt Calls Messages that prior authorization for Wegovy 2.4mg /0.65ml is required/requested.   Insurance verification completed.   The patient is insured through [employer] .   Per test claim: PA required; PA submitted to above mentioned insurance via CoverMyMeds Key/confirmation #/EOC [id number] Status is pending

## 2023-11-09 ENCOUNTER — Other Ambulatory Visit (HOSPITAL_COMMUNITY): Payer: Self-pay

## 2023-11-09 NOTE — Telephone Encounter (Signed)
Pharmacy Patient Advocate Encounter  Received notification from [employer] that Prior Authorization for [facility] 2.4mg /0.19ml has been APPROVED from 11/07/23 to 11/08/24. Spoke to pharmacy to process.Copay is $24.99 and they said it will be ready at 4:30pm today.    PA #/Case ID/Reference #: [id number]

## 2023-11-10 ENCOUNTER — Other Ambulatory Visit: Payer: Self-pay

## 2023-11-10 ENCOUNTER — Other Ambulatory Visit (HOSPITAL_COMMUNITY): Payer: Self-pay

## 2023-11-14 ENCOUNTER — Other Ambulatory Visit (HOSPITAL_COMMUNITY): Payer: Self-pay

## 2023-11-24 ENCOUNTER — Other Ambulatory Visit (HOSPITAL_COMMUNITY)
Admission: RE | Admit: 2023-11-24 | Discharge: 2023-11-24 | Disposition: A | Discharge status: Home / Self Care | Payer: Self-pay | Source: Ambulatory Visit | Attending: Oncology | Admitting: Oncology

## 2023-11-24 DIAGNOSIS — Z006 Encounter for examination for normal comparison and control in clinical research program: Secondary | ICD-10-CM | POA: Insufficient documentation

## 2023-12-05 LAB — GENECONNECT MOLECULAR SCREEN: Genetic Analysis Overall Interpretation: NEGATIVE

## 2024-01-02 ENCOUNTER — Other Ambulatory Visit (HOSPITAL_COMMUNITY): Payer: Self-pay

## 2024-02-16 ENCOUNTER — Ambulatory Visit: Payer: Managed Care, Other (non HMO) | Admitting: Nurse Practitioner

## 2024-02-16 ENCOUNTER — Other Ambulatory Visit (HOSPITAL_COMMUNITY): Payer: Self-pay

## 2024-02-16 VITALS — BP 118/82 | HR 90 | Temp 97.8°F | Ht 66.0 in | Wt 119.4 lb

## 2024-02-16 DIAGNOSIS — Z6830 Body mass index (BMI) 30.0-30.9, adult: Secondary | ICD-10-CM | POA: Diagnosis not present

## 2024-02-16 DIAGNOSIS — E66811 Obesity, class 1: Secondary | ICD-10-CM

## 2024-02-16 DIAGNOSIS — G4700 Insomnia, unspecified: Secondary | ICD-10-CM

## 2024-02-16 MED ORDER — TRAZODONE HCL 50 MG PO TABS
25.0000 mg | ORAL_TABLET | Freq: Every evening | ORAL | 1 refills | Status: DC | PRN
Start: 2024-02-16 — End: 2024-03-15
  Filled 2024-02-16: qty 30, 30d supply, fill #0

## 2024-02-16 NOTE — Assessment & Plan Note (Signed)
 Chronic Starting weight 182 pounds, starting BMI 30.37 Current weight 119 pounds, current BMI 19.27 We discussed the need to maintain current weight and that I recommend she not lose any additional weight.  We discussed if she continues to see weight loss we should likely reduce dose to 1.7 mg/week.  She was encouraged to continue focusing on healthy lifestyle including healthy diet and continued exercise regularly.  She reports her understanding.

## 2024-02-16 NOTE — Assessment & Plan Note (Addendum)
 Chronic Per shared decision making we will trial trazodone 25 to 50 mg by mouth at bedtime as needed.  She was educated to only take the medication when she has at least 8 hours available for her to sleep.  She reports understanding.  We did discuss risk of suicidal ideation and she was given a handout about the medication to review.  She will follow-up with me in 6 months, and was encouraged to reach out sooner if she has any concerns or negative side effects.  I also offered referral to counseling for cognitive behavioral therapy to help with insomnia but she has declined for now.  We also discussed possibly considering ruling out sleep apnea via sleep study.  She seems to be at low risk for this, but was told if trazodone does not help with insomnia we should pursue this further.  She reports her understanding and was encouraged to let me know if trazodone is ineffective.

## 2024-02-16 NOTE — Progress Notes (Signed)
 Established Patient Office Visit  Subjective   Patient ID: Candice King, female    DOB: [birth year]  Age: 53 y.o. MRN: [id number]  Chief Complaint  Patient presents with   Insomnia   Insomnia: Continues to struggle with staying asleep at night.  She does take over the counter equate to sleep aid.  Reports this will help her with falling asleep initially but she is unable to stay asleep for long.  Reports less than or equal to 4 hours of sleep per night.  Denies snoring or being told she has had witnessed apneic episodes.  EKG completed in June 2024 which did identify normal QTc of 440.  Obesity: Continues on Wegovy 2.4 mg weekly injection.  Starting weight 182 pounds, starting BMI 30.37.  Current weight 119 pounds, current BMI 19.27.  She reports that her weight has plateaued at home, she tells me over the last week she is actually gained about 2 pounds.  She reports that she has been exercising.  She reports overall she feels much healthier.     ROS: see HPI    Objective:     BP 118/82   Pulse 90   Temp 97.8 F (36.6 C) (Temporal)   Ht 5\' 6"  (1.676 m)   Wt 119 lb 6 oz (54.1 kg)   SpO2 99%   BMI 19.27 kg/m  BP Readings from Last 3 Encounters:  02/16/24 118/82  09/05/23 122/80  07/29/23 120/76   Wt Readings from Last 3 Encounters:  02/16/24 119 lb 6 oz (54.1 kg)  09/05/23 142 lb (64.4 kg)  07/29/23 150 lb (68 kg)      Physical Exam Vitals reviewed.  Constitutional:      General: She is not in acute distress.    Appearance: Normal appearance.  HENT:     Head: Normocephalic and atraumatic.  Neck:     Vascular: No carotid bruit.  Cardiovascular:     Rate and Rhythm: Normal rate and regular rhythm.     Pulses: Normal pulses.     Heart sounds: Normal heart sounds.  Pulmonary:     Effort: Pulmonary effort is normal.     Breath sounds: Normal breath sounds.  Skin:    General: Skin is warm and dry.  Neurological:     General: No focal deficit present.      Mental Status: She is alert and oriented to person, place, and time.  Psychiatric:        Mood and Affect: Mood normal.        Behavior: Behavior normal.        Judgment: Judgment normal.      No results found for any visits on 02/16/24.    The 10-year ASCVD risk score (Arnett DK, et al., 2019) is: 4.2%    Assessment & Plan:   Problem List Items Addressed This Visit       Other   Obesity   Chronic Starting weight 182 pounds, starting BMI 30.37 Current weight 119 pounds, current BMI 19.27 We discussed the need to maintain current weight and that I recommend she not lose any additional weight.  We discussed if she continues to see weight loss we should likely reduce dose to 1.7 mg/week.  She was encouraged to continue focusing on healthy lifestyle including healthy diet and continued exercise regularly.  She reports her understanding.      Insomnia - Primary   Chronic Per shared decision making we will trial trazodone 25 to 50 mg by  mouth at bedtime as needed.  She was educated to only take the medication when she has at least 8 hours available for her to sleep.  She reports understanding.  We did discuss risk of suicidal ideation and she was given a handout about the medication to review.  She will follow-up with me in 6 months, and was encouraged to reach out sooner if she has any concerns or negative side effects.  I also offered referral to counseling for cognitive behavioral therapy to help with insomnia but she has declined for now.  We also discussed possibly considering ruling out sleep apnea via sleep study.  She seems to be at low risk for this, but was told if trazodone does not help with insomnia we should pursue this further.  She reports her understanding and was encouraged to let me know if trazodone is ineffective.      Relevant Medications   traZODone (DESYREL) 50 MG tablet    Return in about 6 months (around 08/18/2024) for CPE with Maralyn Sago.    Elenore Paddy,  NP

## 2024-03-05 ENCOUNTER — Encounter: Payer: Self-pay | Admitting: Oncology

## 2024-03-13 ENCOUNTER — Encounter: Payer: Self-pay | Admitting: Nurse Practitioner

## 2024-03-15 ENCOUNTER — Other Ambulatory Visit: Payer: Self-pay | Admitting: Nurse Practitioner

## 2024-03-15 DIAGNOSIS — G4700 Insomnia, unspecified: Secondary | ICD-10-CM

## 2024-03-15 MED ORDER — TRAZODONE HCL 100 MG PO TABS
100.0000 mg | ORAL_TABLET | Freq: Every evening | ORAL | 0 refills | Status: DC | PRN
Start: 2024-03-15 — End: 2024-06-11

## 2024-03-15 NOTE — Telephone Encounter (Signed)
 This encounter was created in error - please disregard.

## 2024-03-26 ENCOUNTER — Other Ambulatory Visit: Payer: Self-pay | Admitting: Nurse Practitioner

## 2024-03-26 ENCOUNTER — Other Ambulatory Visit (HOSPITAL_COMMUNITY): Payer: Self-pay

## 2024-03-26 ENCOUNTER — Encounter: Payer: Self-pay | Admitting: Nurse Practitioner

## 2024-03-26 ENCOUNTER — Telehealth: Payer: Self-pay

## 2024-03-26 DIAGNOSIS — E66811 Obesity, class 1: Secondary | ICD-10-CM

## 2024-03-26 MED ORDER — WEGOVY 2.4 MG/0.75ML ~~LOC~~ SOAJ
2.4000 mg | SUBCUTANEOUS | 1 refills | Status: DC
Start: 2024-03-26 — End: 2024-03-30
  Filled 2024-03-26: qty 9, 84d supply, fill #0

## 2024-03-26 NOTE — Telephone Encounter (Signed)
 Please PA for Wegovy 

## 2024-03-26 NOTE — Telephone Encounter (Signed)
 Pharmacy Patient Advocate Encounter   Received notification from Pt Calls Messages that prior authorization for Wegovy  2.4mg /0.66ml is required/requested.   Insurance verification completed.   The patient is insured through [employer] .   Per test claim: Patient has a Prior authorization that is good until 11/08/2024 and is in need of refills for her Wegovy .

## 2024-03-29 NOTE — Telephone Encounter (Signed)
 Message send to provider to address

## 2024-03-30 ENCOUNTER — Other Ambulatory Visit: Payer: Self-pay | Admitting: Nurse Practitioner

## 2024-03-30 DIAGNOSIS — Z6830 Body mass index (BMI) 30.0-30.9, adult: Secondary | ICD-10-CM

## 2024-03-30 MED ORDER — WEGOVY 2.4 MG/0.75ML ~~LOC~~ SOAJ
2.4000 mg | SUBCUTANEOUS | 1 refills | Status: DC
Start: 2024-03-30 — End: 2024-05-23

## 2024-04-15 ENCOUNTER — Other Ambulatory Visit: Payer: Self-pay | Admitting: Internal Medicine

## 2024-04-15 DIAGNOSIS — I700 Atherosclerosis of aorta: Secondary | ICD-10-CM

## 2024-04-15 DIAGNOSIS — E7841 Elevated Lipoprotein(a): Secondary | ICD-10-CM

## 2024-04-15 DIAGNOSIS — E7800 Pure hypercholesterolemia, unspecified: Secondary | ICD-10-CM

## 2024-04-17 ENCOUNTER — Other Ambulatory Visit: Payer: Self-pay

## 2024-04-17 ENCOUNTER — Other Ambulatory Visit (HOSPITAL_BASED_OUTPATIENT_CLINIC_OR_DEPARTMENT_OTHER): Payer: Self-pay

## 2024-04-17 ENCOUNTER — Other Ambulatory Visit (HOSPITAL_COMMUNITY): Payer: Self-pay

## 2024-04-17 MED ORDER — REPATHA SURECLICK 140 MG/ML ~~LOC~~ SOAJ
1.0000 mL | SUBCUTANEOUS | 1 refills | Status: DC
Start: 2024-04-17 — End: 2024-09-29
  Filled 2024-04-17: qty 6, 84d supply, fill #0
  Filled 2024-07-09 – 2024-07-14 (×3): qty 6, 84d supply, fill #1

## 2024-04-18 ENCOUNTER — Other Ambulatory Visit (HOSPITAL_COMMUNITY): Payer: Self-pay

## 2024-05-23 ENCOUNTER — Encounter: Payer: Self-pay | Admitting: Nurse Practitioner

## 2024-05-23 ENCOUNTER — Other Ambulatory Visit: Payer: Self-pay | Admitting: Family

## 2024-05-23 MED ORDER — WEGOVY 1.7 MG/0.75ML ~~LOC~~ SOAJ: 1.7000 mg | SUBCUTANEOUS | 1 refills | Status: DC

## 2024-05-24 ENCOUNTER — Other Ambulatory Visit: Payer: Self-pay

## 2024-05-24 ENCOUNTER — Other Ambulatory Visit (HOSPITAL_COMMUNITY): Payer: Self-pay

## 2024-05-24 MED ORDER — WEGOVY 1.7 MG/0.75ML ~~LOC~~ SOAJ
1.7000 mg | SUBCUTANEOUS | 1 refills | Status: DC
  Filled 2024-05-24 – 2024-06-25 (×5): qty 3, 28d supply, fill #0
  Filled 2024-07-24: qty 3, 28d supply, fill #1

## 2024-05-28 ENCOUNTER — Other Ambulatory Visit (HOSPITAL_COMMUNITY): Payer: Self-pay

## 2024-05-29 ENCOUNTER — Other Ambulatory Visit (HOSPITAL_COMMUNITY): Payer: Self-pay

## 2024-05-29 ENCOUNTER — Other Ambulatory Visit: Payer: Self-pay

## 2024-05-30 ENCOUNTER — Ambulatory Visit: Payer: Self-pay

## 2024-05-30 NOTE — Telephone Encounter (Signed)
 FYI Only or Action Required?: FYI only for provider.  Patient was last seen in primary care on 02/16/2024 by Elnor Lauraine BRAVO, NP.  Called Nurse Triage reporting Fall and Elbow Injury.  Symptoms began about a month ago.  Interventions attempted: OTC medications: ibuprofen.  Symptoms are: left elbow pain and swelling unchanged.  Triage Disposition: See PCP When Office is Open (Within 3 Days)  Patient/caregiver understands and will follow disposition?: Yes            1. MECHANISM: How did the injury happen? Patient states she was in the shower, stood up to reach for her towel and when she stepped back she doesn't remember slipping/falling, instead she states she just remember impact. She states her left elbow and hip took the impact when she fell. Patient denies LOC, she states she did hit her head.  2. ONSET: When did the injury happen? (e.g., minutes, hours ago) About a month ago.  3. LOCATION: What part of the elbow is injured? Left elbow.  4. APPEARANCE of INJURY: What does the injury look like? After the fall the elbow was bleeding, which resolved. Patient states the elbow looks a little bit more swollen than the right side. Otherwise, no lumps or bumps.  5. SEVERITY: Can you use the elbow normally? Can you bend it and straighten it fully? She states she can fully bend it, when she straightens her arm it is painful.  6. SIZE: For cuts, bruises, or swelling, ask: How large is it? (e.g., inches or centimeters; entire joint) Mild swelling.  7. PAIN: Is there pain? If Yes, ask: How bad is the pain? (Scale 0-10; or none, mild, moderate, severe) - NONE (0): No pain. - MILD (1-3): Doesn't interfere with normal activities. - MODERATE (4-7): Interferes with normal activities (e.g., work or school) or awakens from sleep. - SEVERE (8-10): Excruciating pain, unable to do any normal activities, unable to use arm at all. 10/10 pain if she hits the elbow. At rest it  is 5/10, aches.  8. TETANUS: For any breaks in the skin, ask: When was your last tetanus booster? 2016.  9. OTHER SYMPTOMS: Do you have any other symptoms? (e.g., numbness in hand) None.  10. PREGNANCY: Is there any chance you are pregnant? When was your last menstrual period? N/A.                 Copied from CRM [id number]. Topic: Clinical - Red Word Triage >> May 30, 2024 11:09 AM Robinson H wrote: [facility] that prompted transfer to Nurse Triage: [place] about a month ago in shower, hit elbow and not better, painful Reason for Disposition  [1] After 2 weeks AND [2] still painful  Protocols used: Elbow Injury-A-AH

## 2024-05-31 ENCOUNTER — Other Ambulatory Visit (HOSPITAL_COMMUNITY): Payer: Self-pay

## 2024-05-31 ENCOUNTER — Telehealth: Payer: Self-pay

## 2024-05-31 NOTE — Telephone Encounter (Signed)
 Pharmacy Patient Advocate Encounter   Received notification from Fax that prior authorization for WEGOVY  is required/requested.   Insurance verification completed.   The patient is insured through [employer] .   Per test claim: Refill too soon. PA is not needed at this time. Medication was filled 05/29/24. Next eligible fill date is 06/11/24.

## 2024-06-01 ENCOUNTER — Ambulatory Visit (INDEPENDENT_AMBULATORY_CARE_PROVIDER_SITE_OTHER)

## 2024-06-01 ENCOUNTER — Ambulatory Visit: Admitting: Family Medicine

## 2024-06-01 VITALS — BP 126/84 | HR 82 | Temp 98.2°F | Ht 66.0 in | Wt 109.0 lb

## 2024-06-01 DIAGNOSIS — M25522 Pain in left elbow: Secondary | ICD-10-CM

## 2024-06-01 NOTE — Progress Notes (Unsigned)
   Acute Office Visit  Subjective:     Patient ID: Candice King, female    DOB: Oct 19, 1971, 53 y.o.   MRN: [id number]  Chief Complaint  Patient presents with   Elbow Injury    Patient had a fall while in the shower and landed on her left side of her body and elbow hurts the most. She does state she hit her head and her elbow was bleeding. Patient is only taking tylenol  when the pain is too much     HPI Patient presents for evaluation of left elbow pain x 1 month after a fall in the shower. She is right-hand dominant. She states the pain is intermittent but worsens if she strikes her elbow on something or when resting her elbow on a solid surface. She denies, swelling, limited ROM, or weakness in that extremity. She reports she is using over-the-counter analgesics for pain which seems to provide moderate pain relief.   ROS See HPI     Objective:    BP 126/84 (BP Location: Left Arm, Patient Position: Sitting, Cuff Size: Normal)   Pulse 82   Temp 98.2 F (36.8 C) (Oral)   Ht 5' 6 (1.676 m)   Wt 109 lb (49.4 kg)   SpO2 100%   BMI 17.59 kg/m  {Vitals History (Optional):23777}  Physical Exam Vitals and nursing note reviewed.  Constitutional:      Appearance: Normal appearance.  HENT:     Head: Normocephalic and atraumatic.  Cardiovascular:     Rate and Rhythm: Normal rate and regular rhythm.     Pulses: Normal pulses.     Heart sounds: Normal heart sounds.  Pulmonary:     Effort: Pulmonary effort is normal.     Breath sounds: Normal breath sounds.  Musculoskeletal:        General: Normal range of motion.     Left elbow: Tenderness present in medial epicondyle and olecranon process.  Skin:    General: Skin is warm and dry.     Capillary Refill: Capillary refill takes less than 2 seconds.  Neurological:     Mental Status: She is alert and oriented to person, place, and time.  Psychiatric:        Mood and Affect: Mood normal.        Behavior: Behavior normal.    No  results found for any visits on 06/01/24.     Assessment & Plan:   Problem List Items Addressed This Visit   None Visit Diagnoses       Left elbow pain    -  Primary   Relevant Orders   DG Elbow Complete Left      - Will wait for xray result. May refer to PT if needed once results return.   No orders of the defined types were placed in this encounter.   Return if symptoms worsen or fail to improve.  Debby CHRISTELLA Borer, RN

## 2024-06-01 NOTE — Patient Instructions (Addendum)
 We are getting an xray today. We will be in contact with any abnormal results that require further attention.  Follow-up with me for new or worsening symptoms.

## 2024-06-01 NOTE — Progress Notes (Unsigned)
 Acute Office Visit  Subjective:     Patient ID: Candice King, female    DOB: 11-21-1971, 53 y.o.   MRN: [id number]  Chief Complaint  Patient presents with   Elbow Injury    Patient had a fall while in the shower and landed on her left side of her body and elbow hurts the most. She does state she hit her head and her elbow was bleeding. Patient is only taking tylenol  when the pain is too much     HPI  Discussed the use of AI scribe software for clinical note transcription with the patient, who gave verbal consent to proceed.  History of Present Illness Candice King is a 53 year old female who presents with persistent elbow pain following a fall.  Left Elbow pain and swelling - Persistent pain in the elbow since a fall - Pain is exacerbated by bumping the elbow and becomes achy at night - Initial swelling and bleeding at the time of injury - Occasionally uses ibuprofen for pain management  History of recent fall - Fall occurred while stepping out of the bath - Possible lightheadedness from standing up too quickly preceding the fall - Sustained impact to the elbow, head, and hip during the fall - No significant discomfort in the head or hip following the incident     ROS Per HPI      Objective:    BP 126/84 (BP Location: Left Arm, Patient Position: Sitting, Cuff Size: Normal)   Pulse 82   Temp 98.2 F (36.8 C) (Oral)   Ht 5' 6 (1.676 m)   Wt 109 lb (49.4 kg)   SpO2 100%   BMI 17.59 kg/m    Physical Exam Vitals and nursing note reviewed.  Constitutional:      General: She is not in acute distress.    Appearance: Normal appearance. She is normal weight.  HENT:     Head: Normocephalic and atraumatic.     Right Ear: External ear normal.     Left Ear: External ear normal.     Nose: Nose normal.     Mouth/Throat:     Mouth: Mucous membranes are moist.     Pharynx: Oropharynx is clear.  Eyes:     Extraocular Movements: Extraocular movements intact.      Pupils: Pupils are equal, round, and reactive to light.  Cardiovascular:     Rate and Rhythm: Normal rate and regular rhythm.     Pulses: Normal pulses.     Heart sounds: Normal heart sounds.  Pulmonary:     Effort: Pulmonary effort is normal. No respiratory distress.     Breath sounds: Normal breath sounds. No wheezing, rhonchi or rales.  Musculoskeletal:        General: Normal range of motion.     Cervical back: Normal range of motion.     Right lower leg: No edema.     Left lower leg: No edema.  Lymphadenopathy:     Cervical: No cervical adenopathy.  Neurological:     General: No focal deficit present.     Mental Status: She is alert and oriented to person, place, and time.  Psychiatric:        Mood and Affect: Mood normal.        Thought Content: Thought content normal.     No results found for any visits on 06/01/24.      Assessment & Plan:   Assessment and Plan Assessment & Plan Elbow Pain Persistent  elbow pain post-fall, localized and worsened by pressure. - Order elbow X-ray. - Recommend ibuprofen PRN. - Advise ice application. - Consider physical therapy if X-ray normal and pain persists.  Hip Pain Mild hip pain post-fall, less severe than elbow pain. - Manage with ibuprofen PRN.  Lightheadedness Lightheadedness post-hot bath, likely vasovagal or orthostatic. - Advise caution when rising from hot baths.      No orders of the defined types were placed in this encounter.   No follow-ups on file.  Corean LITTIE Ku, FNP

## 2024-06-04 ENCOUNTER — Ambulatory Visit: Payer: Self-pay | Admitting: Family Medicine

## 2024-06-04 DIAGNOSIS — M25522 Pain in left elbow: Secondary | ICD-10-CM

## 2024-06-05 ENCOUNTER — Encounter: Payer: Self-pay | Admitting: Family Medicine

## 2024-06-11 ENCOUNTER — Other Ambulatory Visit: Payer: Self-pay | Admitting: Nurse Practitioner

## 2024-06-11 DIAGNOSIS — G4700 Insomnia, unspecified: Secondary | ICD-10-CM

## 2024-06-25 ENCOUNTER — Other Ambulatory Visit (HOSPITAL_COMMUNITY): Payer: Self-pay

## 2024-06-27 ENCOUNTER — Other Ambulatory Visit (HOSPITAL_COMMUNITY): Payer: Self-pay

## 2024-07-09 ENCOUNTER — Telehealth (HOSPITAL_COMMUNITY): Payer: Self-pay

## 2024-07-09 ENCOUNTER — Other Ambulatory Visit (HOSPITAL_COMMUNITY): Payer: Self-pay

## 2024-07-10 ENCOUNTER — Other Ambulatory Visit (HOSPITAL_COMMUNITY): Payer: Self-pay

## 2024-07-10 ENCOUNTER — Telehealth: Payer: Self-pay | Admitting: Pharmacy Technician

## 2024-07-10 NOTE — Telephone Encounter (Signed)
 Pharmacy Patient Advocate Encounter   Received notification from Physician's Office that prior authorization for repatha  is required/requested.   Insurance verification completed.   The patient is insured through [employer] .   Per test claim: PA required; PA submitted to above mentioned insurance via [employer]/confirmation #/EOC [facility] Status is pending

## 2024-07-10 NOTE — Telephone Encounter (Signed)
 PA request has been Received. New Encounter has been or will be created for follow up. For additional info see Pharmacy Prior Auth telephone encounter from 07/10/24.

## 2024-07-13 ENCOUNTER — Other Ambulatory Visit (HOSPITAL_COMMUNITY): Payer: Self-pay

## 2024-07-14 ENCOUNTER — Other Ambulatory Visit (HOSPITAL_COMMUNITY): Payer: Self-pay

## 2024-07-17 ENCOUNTER — Other Ambulatory Visit (HOSPITAL_COMMUNITY): Payer: Self-pay

## 2024-07-17 NOTE — Telephone Encounter (Signed)
 Pharmacy Patient Advocate Encounter  Received notification from [employer] that Prior Authorization for repatha  has been APPROVED from 07/10/24 to 07/14/25. Unable to obtain price due to refill too soon rejection, last fill date 07/14/24 next available fill date10/31/25   PA #/Case ID/Reference #: [id number]

## 2024-07-25 ENCOUNTER — Other Ambulatory Visit (HOSPITAL_COMMUNITY): Payer: Self-pay

## 2024-08-10 ENCOUNTER — Encounter: Admitting: Nurse Practitioner

## 2024-08-20 ENCOUNTER — Other Ambulatory Visit: Payer: Self-pay | Admitting: Family

## 2024-08-22 ENCOUNTER — Encounter: Payer: Self-pay | Admitting: Nurse Practitioner

## 2024-08-22 ENCOUNTER — Other Ambulatory Visit (HOSPITAL_COMMUNITY): Payer: Self-pay

## 2024-08-22 ENCOUNTER — Other Ambulatory Visit: Payer: Self-pay | Admitting: Nurse Practitioner

## 2024-08-22 ENCOUNTER — Encounter (HOSPITAL_COMMUNITY): Payer: Self-pay

## 2024-08-22 ENCOUNTER — Other Ambulatory Visit: Payer: Self-pay

## 2024-08-22 DIAGNOSIS — E66811 Obesity, class 1: Secondary | ICD-10-CM

## 2024-08-22 MED ORDER — WEGOVY 1.7 MG/0.75ML ~~LOC~~ SOAJ
1.7000 mg | SUBCUTANEOUS | 2 refills | Status: DC
  Filled 2024-08-22: qty 3, 28d supply, fill #0
  Filled 2024-09-17: qty 3, 28d supply, fill #1
  Filled 2024-10-19: qty 3, 28d supply, fill #2

## 2024-08-23 ENCOUNTER — Other Ambulatory Visit: Payer: Self-pay

## 2024-08-23 ENCOUNTER — Encounter: Payer: Self-pay | Admitting: Internal Medicine

## 2024-08-23 ENCOUNTER — Other Ambulatory Visit (HOSPITAL_COMMUNITY): Payer: Self-pay

## 2024-08-23 DIAGNOSIS — E7800 Pure hypercholesterolemia, unspecified: Secondary | ICD-10-CM

## 2024-08-25 ENCOUNTER — Other Ambulatory Visit (HOSPITAL_COMMUNITY): Payer: Self-pay

## 2024-08-25 LAB — NMR, LIPOPROFILE
Cholesterol, Total: 157 mg/dL (ref 100–199)
HDL Particle Number: 37.1 umol/L (ref >=30.5)
HDL-C: 76 mg/dL (ref >39)
LDL Particle Number: 580 nmol/L (ref <1000)
LDL Size: 21.2 nm (ref >20.5)
LDL-C (NIH Calc): 66 mg/dL (ref 0–99)
LP-IR Score: <25 (ref <=45)
Small LDL Particle Number: <90 nmol/L (ref <=527)
Triglycerides: 82 mg/dL (ref 0–149)

## 2024-08-26 ENCOUNTER — Ambulatory Visit: Payer: Self-pay | Admitting: Internal Medicine

## 2024-09-05 ENCOUNTER — Other Ambulatory Visit: Payer: Self-pay | Admitting: Family

## 2024-09-05 DIAGNOSIS — G4700 Insomnia, unspecified: Secondary | ICD-10-CM

## 2024-09-10 ENCOUNTER — Ambulatory Visit: Attending: Internal Medicine | Admitting: Internal Medicine

## 2024-09-10 ENCOUNTER — Encounter: Payer: Self-pay | Admitting: Internal Medicine

## 2024-09-10 VITALS — BP 120/82 | HR 103 | Resp 16 | Ht 65.0 in | Wt 106.4 lb

## 2024-09-10 DIAGNOSIS — E7841 Elevated Lipoprotein(a): Secondary | ICD-10-CM

## 2024-09-10 DIAGNOSIS — I700 Atherosclerosis of aorta: Secondary | ICD-10-CM | POA: Diagnosis not present

## 2024-09-10 DIAGNOSIS — E7800 Pure hypercholesterolemia, unspecified: Secondary | ICD-10-CM

## 2024-09-10 MED ORDER — ASPIRIN 81 MG PO TBEC: 81.0000 mg | DELAYED_RELEASE_TABLET | Freq: Every day | ORAL | Status: AC

## 2024-09-10 NOTE — Progress Notes (Signed)
 LIPID CLINIC CONSULT NOTE  Chief Complaint:  Follow-up dyslipidemia  Primary Care Physician: Elnor Lauraine BRAVO, NP  Primary Cardiologist:  None  HPI:  Candice King is a 53 y.o. female who is being seen today for the evaluation of dyslipidemia at the request of Elnor Lauraine BRAVO, NP.  This is a pleasant 53 year old female kindly referred for evaluation management of dyslipidemia.  She lives in [place] but works for the [employer].  She previously had care through [facility] and then recently was seen by [facility].  She has requested transitioning her care here to [facility] and has plan to schedule a new primary care provider with Dr. Penne.  She does have a history of high cholesterol in the past and more recently was noted that her total cholesterol was 318, triglycerides 135, HDL 67 and LDL 224.  She was previously on atorvastatin  for period of time which she said she tolerated but the medicine simply ran out and she did not follow-up.  She was also on lisinopril for hypertension but that medicine was not continued as well.  Blood pressure at her recent office visit was noted to be high and again blood pressure today is elevated at 136/98.  I repeated it personally and it was 140/98.  Hypertension runs in both parents as well as dyslipidemia and she noted a brother who died in his 60s of heart disease.  She did have some mental imaging in September 2023 of the abdomen pelvis which was a CT scan.  This showed aortic atherosclerosis.  04/26/2023  Candice King returns today for follow-up.  She has had some improvement in her lipids on 20 mg of atorvastatin  although I have prescribed 40 mg daily.  She did call in with symptoms of what sounded like orthostatic hypotension.  Her diastolic blood pressure was as low as in the 50s and I advised her to cut her telmisartan  back from 40 to 20 mg daily.  She may have mistakenly cut back her atorvastatin  as well.  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  Subsequent LP(a) blood testing does indicate a significantly elevated LP(a) at 365.4 nmol/L.  Her LDL has come down to 203 from greater than 240 with a particle number of 2666 and a small LDL particle number of 1201.  She will need additional therapy and at this point is a good candidate for PCSK9 inhibitor therapy.  Today was also noted that she was tachycardic.  An EKG was performed lying, sitting and standing which showed a stepwise increase in heart rate.  Subsequently orthostatic vitals were obtained.  This demonstrated a supine BP of 140/85, sitting 133/92, standing 133/85 and after several minutes of standing 128/85.  Although there was a drop in blood pressure it was not significant for orthostasis, but I suspect she may be having these episodes.  That would make sense as to why she felt dizzy and has episodes that were somewhat worse on higher dose blood pressure medicine.  She reports her daughter had an episode as well where she passed out and was attributed to drop in blood pressure with vigorous exercise.  Question whether this could be underlying postural orthostasis/tachycardia syndrome.  09/05/2023  Candice King returns today for follow-up.  She has had a remarkable improvement in her cholesterol over the past 6 months.  Her LDL particle number has gone down to 1195 from 2666, LDL is now  91 (down from 203), all other lipid parameters are within limits.  Her LP(a) is also come down substantially from 365 down to 261.  In addition she is lost about 30 pounds and is on Wegovy .  She is exercising daily and has changed her diet substantially.  Her BMI now is only 22.  Her blood pressure is normal.  Overall she feels wonderful.  09/10/2024  Candice King is seen today in follow-up.  She continues to do well.  She is continue to lose a lot of weight.  Affect today her weight is 106.  She is being weaned down  on her Wegovy .  BMI is only 17.  She has been having issues with headaches.  She is using ibuprofen as needed for that.  I wonder if some of that may be dehydration and have encouraged her to consider improving her hydration to see if that could help her avoid the ibuprofen.  She does have a high LP(a) even after therapy.  This may be a target for upcoming clinical trials which we discussed.  Because of the prothrombotic properties I advised considering low-dose daily aspirin.  Other than that she seems to be doing very well.  Her cholesterol is again significantly improved.  LDL particle number is 580 with an LDL 66, HDL 76 and triglycerides 82.  Small LDL particles less than 90 but was as high as 1201 in the past.  PMHx:  Past Medical History:  Diagnosis Date   Hyperlipidemia    Hypertension     Past Surgical History:  Procedure Laterality Date   ABDOMINAL HYSTERECTOMY     ABDOMINAL SURGERY     CESAREAN SECTION     LAPAROSCOPIC LYSIS OF ADHESIONS N/A 02/14/2018   Procedure: LAPAROSCOPIC LYSIS OF ADHESIONS;  Surgeon: Jertson, Jill Evelyn, MD;  Location: [facility];  Service: Gynecology;  Laterality: N/A;   LAPAROSCOPIC UNILATERAL SALPINGECTOMY Bilateral 02/14/2018   Procedure: LAPAROSCOPIC SALPINGECTOMY;  Surgeon: Jannis Kate Norris, MD;  Location: [facility];  Service: Gynecology;  Laterality: Bilateral;   TUBAL LIGATION      FAMHx:  Family History  Problem Relation Age of Onset   Thyroid  disease Mother    Heart disease Maternal Grandmother    Diabetes Maternal Grandmother    Diabetes Paternal Grandmother     SOCHx:   reports that she has been smoking cigarettes. She has a 10 pack-year smoking history. She has never used smokeless tobacco. She reports current alcohol use of about 2.0 standard drinks of alcohol per week. She reports that she does not use drugs.  ALLERGIES:  No Known Allergies  ROS: Pertinent items noted in HPI and remainder of comprehensive ROS otherwise  negative.  HOME MEDS: Current Outpatient Medications on File Prior to Visit  Medication Sig Dispense Refill   Biotin w/ Vitamins C & E (HAIR SKIN & NAILS GUMMIES PO) Take 2 each by mouth daily.     Evolocumab  (REPATHA  SURECLICK) 140 MG/ML SOAJ Inject 140 mg into the skin every 14 (fourteen) days. 6 mL 1   semaglutide -weight management (WEGOVY ) 1.7 MG/0.75ML SOAJ SQ injection Inject 1.7 mg into the skin once a week. 3 mL 2   traZODone  (DESYREL ) 100 MG tablet TAKE 1 TABLET(100 MG) BY MOUTH AT BEDTIME AS NEEDED FOR SLEEP 90 tablet 0   ibuprofen (ADVIL,MOTRIN) 800 MG tablet Take 800 mg by mouth every 8 (eight) hours as needed for headache or moderate pain (pain score 4-6).     No current facility-administered medications on file  prior to visit.    LABS/IMAGING: No results found for this or any previous visit (from the past 48 hours). No results found.  LIPID PANEL:    Component Value Date/Time   CHOL 318 (H) 07/21/2022 1115   TRIG 135.0 07/21/2022 1115   HDL 67.60 07/21/2022 1115   CHOLHDL 5 07/21/2022 1115   VLDL 27.0 07/21/2022 1115   LDLCALC 224 (H) 07/21/2022 1115    WEIGHTS: Wt Readings from Last 3 Encounters:  09/10/24 106 lb 6.4 oz (48.3 kg)  06/01/24 109 lb (49.4 kg)  02/16/24 119 lb 6 oz (54.1 kg)    VITALS: BP 120/82 (BP Location: Left Arm, Patient Position: Sitting, Cuff Size: Normal)   Pulse (!) 103   Resp 16   Ht 5' 5 (1.651 m)   Wt 106 lb 6.4 oz (48.3 kg)   SpO2 99%   BMI 17.71 kg/m   EXAM: Deferred  EKG: Deferred  ASSESSMENT: Familial hyperlipidemia, LDL greater than 190 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   Ms. Kimbell has had substantial weight loss due to dietary changes and Wegovy .  She is actually underweight at this point and her PCP is working to cut back on her medication.  Her lipids are very well-controlled.  I would  recommend continue her current therapies.  LP(a) is elevated and she may be a candidate for the upcoming clinical trial of a passer and for prevention.  She is interested.  Other than that I advised considering low-dose daily aspirin 81 mg because of the high LP(a).  She should not take this with ibuprofen I would look for other ways to try to help with her more regular headaches which could be due to dehydration in the setting of substantial weight loss and calorie deficit.  Will arrange for follow-up with Rosaline at [facility] in a year or sooner as necessary.  Vinie KYM Maxcy, MD, [facility], FNLA, FACP  [place]  [facility]  Medical Director of the [facility] Lipid Disorders &  Cardiovascular Risk Reduction Clinic Diplomate of the American Board of Clinical Lipidology Attending Cardiologist  Direct Dial: [phone]  Fax: [phone]  Website:  [email or web address]   Vinie BROCKS Alandis Bluemel 09/10/2024, 8:13 AM

## 2024-09-10 NOTE — Patient Instructions (Signed)
 Medication Instructions:  Your physician has recommended you make the following change in your medication:  1) START taking Apirin 81 mg once daily *If you need a refill on your cardiac medications before your next appointment, please call your pharmacy*  Follow-Up: At [facility], you and your health needs are our priority.  As part of our continuing mission to provide you with exceptional heart care, our providers are all part of one team.  This team includes your primary Cardiologist (physician) and Advanced Practice Providers or APPs (Physician Assistants and Nurse Practitioners) who all work together to provide you with the care you need, when you need it.  Your next appointment:   1 year  Provider:   Rosaline Bane, NP

## 2024-09-17 ENCOUNTER — Encounter: Payer: Self-pay | Admitting: Nurse Practitioner

## 2024-09-21 ENCOUNTER — Other Ambulatory Visit (HOSPITAL_COMMUNITY): Payer: Self-pay

## 2024-09-21 ENCOUNTER — Other Ambulatory Visit: Payer: Self-pay

## 2024-09-21 DIAGNOSIS — G4700 Insomnia, unspecified: Secondary | ICD-10-CM

## 2024-09-24 ENCOUNTER — Other Ambulatory Visit (HOSPITAL_COMMUNITY): Payer: Self-pay

## 2024-09-24 ENCOUNTER — Other Ambulatory Visit: Payer: Self-pay | Admitting: Nurse Practitioner

## 2024-09-24 DIAGNOSIS — G4700 Insomnia, unspecified: Secondary | ICD-10-CM

## 2024-09-24 MED ORDER — TRAZODONE HCL 100 MG PO TABS
100.0000 mg | ORAL_TABLET | Freq: Every evening | ORAL | 0 refills | Status: DC | PRN
Start: 2024-09-24 — End: 2024-10-05
  Filled 2024-09-24: qty 15, 15d supply, fill #0

## 2024-09-29 ENCOUNTER — Other Ambulatory Visit: Payer: Self-pay | Admitting: Internal Medicine

## 2024-09-29 DIAGNOSIS — I700 Atherosclerosis of aorta: Secondary | ICD-10-CM

## 2024-09-29 DIAGNOSIS — E7800 Pure hypercholesterolemia, unspecified: Secondary | ICD-10-CM

## 2024-09-29 DIAGNOSIS — E7841 Elevated Lipoprotein(a): Secondary | ICD-10-CM

## 2024-10-01 ENCOUNTER — Other Ambulatory Visit (HOSPITAL_COMMUNITY): Payer: Self-pay

## 2024-10-01 ENCOUNTER — Other Ambulatory Visit: Payer: Self-pay

## 2024-10-01 MED ORDER — REPATHA SURECLICK 140 MG/ML ~~LOC~~ SOAJ
1.0000 mL | SUBCUTANEOUS | 3 refills | Status: AC
  Filled 2024-10-01: qty 6, 84d supply, fill #0
  Filled 2024-12-15: qty 6, 84d supply, fill #1

## 2024-10-05 ENCOUNTER — Ambulatory Visit: Admitting: Nurse Practitioner

## 2024-10-05 ENCOUNTER — Other Ambulatory Visit (HOSPITAL_COMMUNITY): Payer: Self-pay

## 2024-10-05 VITALS — BP 116/68 | Temp 98.1°F | Ht 65.0 in | Wt 103.2 lb

## 2024-10-05 DIAGNOSIS — G4700 Insomnia, unspecified: Secondary | ICD-10-CM

## 2024-10-05 DIAGNOSIS — Z0000 Encounter for general adult medical examination without abnormal findings: Secondary | ICD-10-CM

## 2024-10-05 DIAGNOSIS — Z0001 Encounter for general adult medical examination with abnormal findings: Secondary | ICD-10-CM

## 2024-10-05 DIAGNOSIS — R7303 Prediabetes: Secondary | ICD-10-CM

## 2024-10-05 DIAGNOSIS — I10 Essential (primary) hypertension: Secondary | ICD-10-CM | POA: Diagnosis not present

## 2024-10-05 DIAGNOSIS — Z6830 Body mass index (BMI) 30.0-30.9, adult: Secondary | ICD-10-CM | POA: Diagnosis not present

## 2024-10-05 DIAGNOSIS — E785 Hyperlipidemia, unspecified: Secondary | ICD-10-CM

## 2024-10-05 DIAGNOSIS — Z23 Encounter for immunization: Secondary | ICD-10-CM

## 2024-10-05 DIAGNOSIS — E66811 Obesity, class 1: Secondary | ICD-10-CM | POA: Diagnosis not present

## 2024-10-05 LAB — COMPREHENSIVE METABOLIC PANEL WITH GFR
ALT: 55 U/L — ABNORMAL HIGH (ref 0–35)
AST: 33 U/L (ref 0–37)
Albumin: 4.1 g/dL (ref 3.5–5.2)
Alkaline Phosphatase: 72 U/L (ref 39–117)
BUN: 12 mg/dL (ref 6–23)
CO2: 30 meq/L (ref 19–32)
Calcium: 9.1 mg/dL (ref 8.4–10.5)
Chloride: 101 meq/L (ref 96–112)
Creatinine, Ser: 0.79 mg/dL (ref 0.40–1.20)
GFR: 85.27 mL/min (ref >60.00)
Glucose, Bld: 71 mg/dL (ref 70–99)
Potassium: 3.7 meq/L (ref 3.5–5.1)
Sodium: 137 meq/L (ref 135–145)
Total Bilirubin: 0.4 mg/dL (ref 0.2–1.2)
Total Protein: 7.2 g/dL (ref 6.0–8.3)

## 2024-10-05 LAB — CBC
HCT: 37.8 % (ref 36.0–46.0)
Hemoglobin: 12.8 g/dL (ref 12.0–15.0)
MCHC: 34 g/dL (ref 30.0–36.0)
MCV: 92.5 fl (ref 78.0–100.0)
Platelets: 234 K/uL (ref 150.0–400.0)
RBC: 4.08 Mil/uL (ref 3.87–5.11)
RDW: 13.5 % (ref 11.5–15.5)
WBC: 4.6 K/uL (ref 4.0–10.5)

## 2024-10-05 LAB — TSH: TSH: 0.65 u[IU]/mL (ref 0.35–5.50)

## 2024-10-05 MED ORDER — TRAZODONE HCL 50 MG PO TABS
ORAL_TABLET | ORAL | 0 refills | Status: AC
Start: 2024-10-05 — End: ?
  Filled 2024-10-05: qty 11, 14d supply, fill #0

## 2024-10-05 MED ORDER — DOXEPIN HCL 3 MG PO TABS
3.0000 mg | ORAL_TABLET | Freq: Every day | ORAL | 1 refills | Status: DC
  Filled 2024-10-05: qty 30, 30d supply, fill #0
  Filled 2024-11-12: qty 30, 30d supply, fill #1

## 2024-10-05 NOTE — Assessment & Plan Note (Signed)
 Well controlled. Continue off of pharmacologic treatment.

## 2024-10-05 NOTE — Patient Instructions (Addendum)
 Transition off of the trazodone  and onto the doxepin for difficulty with sleep.  Week 1: Start by taking trazodone  50mg  by mouth at bedtime x 7 days. Week 2: Start taking trazodone  25mg  (1/2 tablet) by mouth at bedtime AND start doxepin 3mg  tablet by mouth at bedtime x 7 days.  Week 3: STOP the trazodone  and continue on the doxepin 3mg  by mouth at bedtime.  If you experience suicidal thoughts, fever, tremors, anxiety, confusion this can be a sign of potentially life threatening side effects so let us  know immediately if this occurs.

## 2024-10-05 NOTE — Assessment & Plan Note (Signed)
 Underweight secondary to anti-obesity therapy Weight decreased to 103 lbs, BMI 17. - Adjust Wegovy  to 1.7 mg every other week to stabilize weight. - Monitor weight and energy levels.

## 2024-10-05 NOTE — Assessment & Plan Note (Signed)
 General adult medical examination with abnormal findings Routine examination with no acute concerns. Up to date with colonoscopy and tetanus vaccine. Mammogram scheduled. Declined flu shot. - Continue routine health maintenance and screenings. - Proceed with scheduled mammogram. - Shingles vaccine administered, administer next vaccine at follow-up in 3 months. VIS provided.

## 2024-10-05 NOTE — Assessment & Plan Note (Signed)
 Labs ordered, further recommendations may be made based upon his results.

## 2024-10-05 NOTE — Progress Notes (Signed)
 Complete physical exam  Patient: Candice King   DOB: [birth year]   53 y.o. Female  MRN: [id number]  Subjective:    Chief Complaint  Patient presents with   Annual Exam    Fedora Knisely is a 53 y.o. female who presents today for a complete physical exam.   Discussed the use of AI scribe software for clinical note transcription with the patient, who gave verbal consent to proceed.  History of Present Illness A 53 year old female presents for an annual physical exam.  Preventive health maintenance - Up to date with colonoscopy and tetanus vaccine - Mammogram scheduled - Never received influenza vaccine and declines it - Interested in receiving Shingrix vaccine  Sleep disturbance - Takes trazodone  nightly for sleep - Experiences nocturnal awakenings despite trazodone  - Has tried melatonin gummies  Weight loss and appetite - On Wegovy  1.7 mg weekly for weight management - Weight decreased from 109 lbs to 103 lbs since July - Current BMI is 17 - Eats regular meals and sweets - Energy levels are stable    Most recent fall risk assessment:    06/01/2024    1:19 PM  Fall Risk   Falls in the past year? 1  Number falls in past yr: 0  Injury with Fall? 1  Risk for fall due to : No Fall Risks  Follow up Falls evaluation completed     Most recent depression screenings:    06/01/2024    1:19 PM 07/29/2023    2:00 PM  PHQ 2/9 Scores  PHQ - 2 Score 0 1      Past Medical History:  Diagnosis Date   Hyperlipidemia    Hypertension    Past Surgical History:  Procedure Laterality Date   ABDOMINAL HYSTERECTOMY     ABDOMINAL SURGERY     CESAREAN SECTION     LAPAROSCOPIC LYSIS OF ADHESIONS N/A 02/14/2018   Procedure: LAPAROSCOPIC LYSIS OF ADHESIONS;  Surgeon: Jertson, Jill Evelyn, MD;  Location: [facility];  Service: Gynecology;  Laterality: N/A;   LAPAROSCOPIC UNILATERAL SALPINGECTOMY Bilateral 02/14/2018   Procedure: LAPAROSCOPIC SALPINGECTOMY;  Surgeon: Jannis Kate Norris, MD;  Location: [facility];  Service: Gynecology;  Laterality: Bilateral;   TUBAL LIGATION     Social History   Tobacco Use   Smoking status: Some Days    Current packs/day: 0.50    Average packs/day: 0.5 packs/day for 20.0 years (10.0 ttl pk-yrs)    Types: Cigarettes   Smokeless tobacco: Never  Vaping Use   Vaping status: Never Used  Substance Use Topics   Alcohol use: Yes    Alcohol/week: 2.0 standard drinks of alcohol    Types: 2 Standard drinks or equivalent per week    Comment: occasional   Drug use: No   Family History  Problem Relation Age of Onset   Thyroid  disease Mother    Heart disease Maternal Grandmother    Diabetes Maternal Grandmother    Diabetes Paternal Grandmother    No Known Allergies    Patient Care Team: Elnor Lauraine BRAVO, NP as PCP - General (Nurse Practitioner) Mona Vinie BROCKS, MD as PCP - Cardiology (Cardiology) Sheldon Standing, MD as Consulting Physician (General Surgery) Kristie Lamprey, MD as Consulting Physician (Gastroenterology)   Outpatient Medications Prior to Visit  Medication Sig   aspirin EC 81 MG tablet Take 1 tablet (81 mg total) by mouth daily. Swallow whole.   Biotin w/ Vitamins C & E (HAIR SKIN & NAILS GUMMIES PO) Take 2 each by mouth  daily.   Evolocumab  (REPATHA  SURECLICK) 140 MG/ML SOAJ Inject 140 mg into the skin every 14 (fourteen) days.   ibuprofen (ADVIL,MOTRIN) 800 MG tablet Take 800 mg by mouth every 8 (eight) hours as needed for headache or moderate pain (pain score 4-6).   semaglutide -weight management (WEGOVY ) 1.7 MG/0.75ML SOAJ SQ injection Inject 1.7 mg into the skin once a week.   [DISCONTINUED] traZODone  (DESYREL ) 100 MG tablet Take 1 tablet (100 mg total) by mouth at bedtime as needed for sleep.   No facility-administered medications prior to visit.    Review of Systems  Constitutional:  Negative for chills, fever and malaise/fatigue.  Respiratory:  Negative for cough, shortness of breath and wheezing.    Cardiovascular:  Negative for chest pain and palpitations.  Gastrointestinal:  Negative for abdominal pain and blood in stool.  Genitourinary:  Negative for dysuria and hematuria.  Psychiatric/Behavioral:  Negative for depression. The patient is not nervous/anxious and does not have insomnia.           Objective:     BP 116/68   Temp 98.1 F (36.7 C) (Temporal)   Ht 5' 5 (1.651 m)   Wt 103 lb 4 oz (46.8 kg)   BMI 17.18 kg/m  BP Readings from Last 3 Encounters:  10/05/24 116/68  09/10/24 120/82  06/01/24 126/84   Wt Readings from Last 3 Encounters:  10/05/24 103 lb 4 oz (46.8 kg)  09/10/24 106 lb 6.4 oz (48.3 kg)  06/01/24 109 lb (49.4 kg)      Physical Exam Vitals reviewed.  Constitutional:      Appearance: Normal appearance.  HENT:     Head: Normocephalic and atraumatic.     Right Ear: Tympanic membrane, ear canal and external ear normal.     Left Ear: Tympanic membrane, ear canal and external ear normal.  Eyes:     General:        Right eye: No discharge.        Left eye: No discharge.     Extraocular Movements: Extraocular movements intact.     Conjunctiva/sclera: Conjunctivae normal.     Pupils: Pupils are equal, round, and reactive to light.  Neck:     Vascular: No carotid bruit.  Cardiovascular:     Rate and Rhythm: Normal rate and regular rhythm.     Pulses: Normal pulses.     Heart sounds: Normal heart sounds. No murmur heard. Pulmonary:     Effort: Pulmonary effort is normal.     Breath sounds: Normal breath sounds.  Chest:     Comments: Breast exam deferred per patient preference Abdominal:     General: Abdomen is flat. Bowel sounds are normal. There is no distension.     Palpations: Abdomen is soft. There is no mass.     Tenderness: There is no abdominal tenderness.  Musculoskeletal:        General: No tenderness.     Cervical back: Neck supple. No muscular tenderness.     Right lower leg: No edema.     Left lower leg: No edema.   Lymphadenopathy:     Cervical: No cervical adenopathy.     Upper Body:     Right upper body: No supraclavicular adenopathy.     Left upper body: No supraclavicular adenopathy.  Skin:    General: Skin is warm and dry.  Neurological:     General: No focal deficit present.     Mental Status: She is alert and oriented to person,  place, and time.     Motor: No weakness.     Gait: Gait normal.  Psychiatric:        Mood and Affect: Mood normal.        Behavior: Behavior normal.        Judgment: Judgment normal.      No results found for any visits on 10/05/24.     Assessment & Plan:    Routine Health Maintenance and Physical Exam  Immunization History  Administered Date(s) Administered   PFIZER(Purple Top)SARS-COV-2 Vaccination 01/30/2020, 02/20/2020   Tdap 11/18/2015   Zoster Recombinant(Shingrix) 10/05/2024    Health Maintenance  Topic Date Due   Hepatitis C Screening  Never done   Pneumococcal Vaccine: 50+ Years (1 of 2 - PCV) Never done   Hepatitis B Vaccines 19-59 Average Risk (1 of 3 - 19+ 3-dose series) Never done   COVID-19 Vaccine (4 - 2025-26 season) 07/23/2024   Cervical Cancer Screening (HPV/Pap Cotest)  02/15/2025 (Originally 02/13/2001)   Influenza Vaccine  02/19/2025 (Originally 06/22/2024)   Zoster Vaccines- Shingrix (2 of 2) 11/30/2024   Mammogram  08/25/2025   DTaP/Tdap/Td (2 - Td or Tdap) 11/17/2025   Colonoscopy  09/20/2032   HPV VACCINES  Aged Out   Meningococcal B Vaccine  Aged Out   HIV Screening  Discontinued    Discussed health benefits of physical activity, and encouraged her to engage in regular exercise appropriate for her age and condition.  Problem List Items Addressed This Visit       Cardiovascular and Mediastinum   HTN, goal below 140/90   Relevant Orders   CBC   Comprehensive metabolic panel with GFR   Hemoglobin A1c   TSH     Other   Obesity   Relevant Orders   CBC   Comprehensive metabolic panel with GFR   Hemoglobin A1c    TSH   Hyperlipidemia   Relevant Orders   CBC   Comprehensive metabolic panel with GFR   Hemoglobin A1c   TSH   Prediabetes   Relevant Orders   CBC   Comprehensive metabolic panel with GFR   Hemoglobin A1c   TSH   Encounter for general adult medical examination with abnormal findings   Insomnia - Primary   Relevant Medications   traZODone  (DESYREL ) 50 MG tablet   Doxepin HCl 3 MG TABS   Other Relevant Orders   CBC   Comprehensive metabolic panel with GFR   Hemoglobin A1c   TSH   Other Visit Diagnoses       Need for vaccination       Relevant Orders   Zoster Recombinant (Shingrix ) (Completed)      Assessment and Plan Assessment & Plan General adult medical examination with abnormal findings Routine examination with no acute concerns. Up to date with colonoscopy and tetanus vaccine. Mammogram scheduled. Declined flu shot. - Continue routine health maintenance and screenings. - Proceed with scheduled mammogram. - Shingles vaccine administered, administer next vaccine at follow-up in 3 months. VIS provided.   Insomnia Chronic insomnia with partial relief from trazodone . Discussed doxepin side effects and advised monitoring for suicidal thoughts. - Taper trazodone  to 50 mg for one week, then 25 mg for one week, then stop.  - Initiate doxepin 3 mg at 25 mg trazodone  dose. - Monitor for side effects and efficacy of doxepin. - Discussed the above strategy with clinical pharmacist   Underweight secondary to anti-obesity therapy Weight decreased to 103 lbs, BMI 17. - Adjust  Wegovy  to 1.7 mg every other week to stabilize weight. - Monitor weight and energy levels.   In addition to annual exam, office visit as described as above was completed today.   Return in about 3 months (around 01/05/2025) for F/U with Hartley Wyke.     Lauraine FORBES Pereyra, NP

## 2024-10-05 NOTE — Assessment & Plan Note (Signed)
 Insomnia Chronic insomnia with partial relief from trazodone . Discussed doxepin side effects and advised monitoring for suicidal thoughts. - Taper trazodone  to 50 mg for one week, then 25 mg for one week, then stop.  - Initiate doxepin 3 mg at 25 mg trazodone  dose. - Monitor for side effects and efficacy of doxepin. - Discussed the above strategy with clinical pharmacist

## 2024-10-08 LAB — HEMOGLOBIN A1C: Hgb A1c MFr Bld: 5.5 % (ref 4.6–6.5)

## 2024-10-09 ENCOUNTER — Ambulatory Visit: Payer: Self-pay | Admitting: Nurse Practitioner

## 2024-11-04 ENCOUNTER — Encounter (HOSPITAL_BASED_OUTPATIENT_CLINIC_OR_DEPARTMENT_OTHER): Payer: Self-pay | Admitting: Emergency Medicine

## 2024-11-04 ENCOUNTER — Emergency Department (HOSPITAL_BASED_OUTPATIENT_CLINIC_OR_DEPARTMENT_OTHER)

## 2024-11-04 ENCOUNTER — Emergency Department (HOSPITAL_BASED_OUTPATIENT_CLINIC_OR_DEPARTMENT_OTHER): Admitting: Radiology

## 2024-11-04 ENCOUNTER — Other Ambulatory Visit: Payer: Self-pay

## 2024-11-04 ENCOUNTER — Ambulatory Visit
Admission: RE | Admit: 2024-11-04 | Discharge: 2024-11-04 | Disposition: A | Discharge status: Home / Self Care | Attending: Nurse Practitioner | Admitting: Nurse Practitioner

## 2024-11-04 ENCOUNTER — Emergency Department (HOSPITAL_BASED_OUTPATIENT_CLINIC_OR_DEPARTMENT_OTHER)
Admission: EM | Admit: 2024-11-04 | Discharge: 2024-11-04 | Disposition: A | Discharge status: Home / Self Care | Attending: Emergency Medicine | Admitting: Emergency Medicine

## 2024-11-04 VITALS — BP 138/90 | HR 117 | Temp 99.1°F | Resp 20 | Ht 65.0 in | Wt 104.0 lb

## 2024-11-04 DIAGNOSIS — M79605 Pain in left leg: Secondary | ICD-10-CM

## 2024-11-04 DIAGNOSIS — R0989 Other specified symptoms and signs involving the circulatory and respiratory systems: Secondary | ICD-10-CM | POA: Diagnosis not present

## 2024-11-04 DIAGNOSIS — M79652 Pain in left thigh: Secondary | ICD-10-CM | POA: Insufficient documentation

## 2024-11-04 DIAGNOSIS — Z7982 Long term (current) use of aspirin: Secondary | ICD-10-CM | POA: Insufficient documentation

## 2024-11-04 MED ORDER — OXYCODONE HCL 5 MG PO TABS
5.0000 mg | ORAL_TABLET | Freq: Once | ORAL | Status: AC
  Administered 2024-11-04: 5 mg via ORAL
  Filled 2024-11-04: qty 1

## 2024-11-04 MED ORDER — KETOROLAC TROMETHAMINE 15 MG/ML IJ SOLN
15.0000 mg | Freq: Once | INTRAMUSCULAR | Status: AC
  Administered 2024-11-04: 15 mg via INTRAMUSCULAR
  Filled 2024-11-04: qty 1

## 2024-11-04 MED ORDER — ACETAMINOPHEN 500 MG PO TABS
1000.0000 mg | ORAL_TABLET | Freq: Once | ORAL | Status: AC
  Administered 2024-11-04: 1000 mg via ORAL
  Filled 2024-11-04: qty 2

## 2024-11-04 MED ORDER — METHOCARBAMOL 500 MG PO TABS
500.0000 mg | ORAL_TABLET | Freq: Two times a day (BID) | ORAL | 0 refills | Status: AC
  Filled 2024-11-04: qty 20, 10d supply, fill #0

## 2024-11-04 NOTE — ED Notes (Addendum)
 Patient is being discharged from the Urgent Care and sent to the Emergency Department via private vehicle . Per Rocky Mecum PA, patient is in need of higher level of care due to suspected DVT. Patient is aware and verbalizes understanding of plan of care.   Vitals:   11/04/24 1036  BP: (!) 138/90  Pulse: (!) 117  Resp: 20  Temp: 99.1 F (37.3 C)  SpO2: 99%    Pt refused EMS transport. Family member in lobby to drive.

## 2024-11-04 NOTE — Discharge Instructions (Signed)
 VISIT SUMMARY:  You came in today because of severe pain in your left leg that started on Friday and worsened by Saturday. The pain is deep and aching, sometimes becoming sharp with movement, and it has been affecting your sleep. You have not noticed any swelling, bruising, numbness, or tingling.  YOUR PLAN:  -SUSPECTED DEEP VEIN THROMBOSIS (DVT) OF THE LEFT LOWER EXTREMITY: Deep vein thrombosis (DVT) is a condition where a blood clot forms in a deep vein, usually in the legs. Given the severity and nature of your pain, we are concerned about the possibility of DVT. You need to go to the emergency room or a medical center for an ultrasound to check for DVT. If DVT is confirmed, you may need to start on blood thinners. We have provided you with a list of medical centers for quicker access to an ultrasound and pain management. If you need help with transportation, please let us  know.  INSTRUCTIONS:  Please go to the emergency room or a medical center as soon as possible for an ultrasound to evaluate for DVT. Follow the advice given at the medical center, and if DVT is confirmed, you may need to start on blood thinners. If you need assistance with transportation, contact our office.

## 2024-11-04 NOTE — ED Provider Notes (Addendum)
 [facility]    CSN: [id number] Arrival date & time: 11/04/24  1032      History   Chief Complaint Chief Complaint  Patient presents with   Leg Pain    Severe sharp pain and aching. - Entered by patient    HPI Candice King is a 53 y.o. female.  has a past medical history of Hyperlipidemia and Hypertension.   HPI  Discussed the use of AI scribe software for clinical note transcription with the patient, who gave verbal consent to proceed.  The patient presents with severe left leg pain. They are accompanied by their son.  They have been experiencing severe cramping and aching pain in the left leg since Friday, which worsened by Friday afternoon and became unbearable by Saturday. The pain is described as a deep, inside ache that becomes sharp with certain movements, persistent, and likened to a 'toothache', affecting their ability to sleep.  The pain radiates from the thigh down the leg, with sharp pain occurring with movement. They have not noticed any swelling or bruising and have tried using topical treatments like Icy Hot patches. No numbness, tingling, difficulty breathing, or coughing is reported.  They are on a daily aspirin  regimen as prescribed by their cardiologist and have not taken any additional medication for the leg pain.   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   Constipation 07/21/2022   Abdominal pain 06/18/2022   Nausea &  vomiting 04/22/2022   Elevated liver enzymes 03/04/2016   HTN, goal below 140/90 03/04/2016   Pure hypercholesterolemia 12/02/2015   Gastroesophageal reflux disease 11/18/2015    Past Surgical History:  Procedure Laterality Date   ABDOMINAL HYSTERECTOMY     ABDOMINAL SURGERY     CESAREAN SECTION     LAPAROSCOPIC LYSIS OF ADHESIONS N/A 02/14/2018   Procedure: LAPAROSCOPIC LYSIS OF ADHESIONS;  Surgeon: Jertson, Jill Evelyn, MD;  Location: [facility];  Service: Gynecology;  Laterality: N/A;   LAPAROSCOPIC UNILATERAL SALPINGECTOMY Bilateral 02/14/2018   Procedure: LAPAROSCOPIC SALPINGECTOMY;  Surgeon: Jannis Kate Norris, MD;  Location: [facility];  Service: Gynecology;  Laterality: Bilateral;   TUBAL LIGATION      OB History     Gravida  5   Para  5   Term  3   Preterm  2   AB      Living  5      SAB      IAB      Ectopic      Multiple      Live Births  5            Home Medications    Prior to Admission medications  Medication Sig Start Date End Date Taking? Authorizing Provider  aspirin  EC 81 MG tablet Take 1 tablet (81 mg total) by mouth daily. Swallow whole. 09/10/24   HiltyVinie BROCKS, MD  Biotin w/ Vitamins C & E (HAIR SKIN & NAILS GUMMIES PO) Take 2 each by mouth daily.  [provider]  Doxepin  HCl 3 MG TABS Take 1 tablet (3 mg total) by mouth at bedtime. 10/05/24   Elnor Lauraine BRAVO, NP  Evolocumab  (REPATHA  SURECLICK) 140 MG/ML SOAJ Inject 140 mg into the skin every 14 (fourteen) days. 10/01/24   Hilty, Vinie BROCKS, MD  ibuprofen (ADVIL,MOTRIN) 800 MG tablet Take 800 mg by mouth every 8 (eight) hours as needed for headache or moderate pain (pain score 4-6).    [provider]  semaglutide -weight management (WEGOVY ) 1.7 MG/0.75ML SOAJ SQ injection Inject 1.7 mg into the skin once a week. 08/22/24   Elnor Lauraine BRAVO, NP  traZODone  (DESYREL ) 50 MG tablet Take 1 tablet by mouth at bedtime x 7 days, then take 0.5 tablets by mouth at bedtime x 7 days,  then stop 10/05/24   Elnor Lauraine BRAVO, NP    Family History Family History  Problem Relation Age of Onset   Thyroid  disease Mother    Heart disease Maternal Grandmother    Diabetes Maternal Grandmother    Diabetes Paternal Grandmother     Social History Social History[1]   Allergies   Patient has no known allergies.   Review of Systems Review of Systems  Constitutional:  Negative for chills and fever.  Respiratory:  Negative for cough, chest tightness, shortness of breath and wheezing.   Musculoskeletal:        Left leg pain- deep      Physical Exam Triage Vital Signs ED Triage Vitals  Encounter Vitals Group     BP 11/04/24 1036 (!) 138/90     Girls Systolic BP Percentile --      Girls Diastolic BP Percentile --      Boys Systolic BP Percentile --      Boys Diastolic BP Percentile --      Pulse Rate 11/04/24 1036 (!) 117     Resp 11/04/24 1036 20     Temp 11/04/24 1036 99.1 F (37.3 C)     Temp Source 11/04/24 1036 Oral     SpO2 11/04/24 1036 99 %     Weight 11/04/24 1036 104 lb (47.2 kg)     Height 11/04/24 1036 5' 5 (1.651 m)     Head Circumference --      Peak Flow --      Pain Score 11/04/24 1057 6     Pain Loc --      Pain Education --      Exclude from Growth Chart --    No data found.  Updated Vital Signs BP (!) 138/90 (BP Location: Right Arm)   Pulse (!) 117   Temp 99.1 F (37.3 C) (Oral)   Resp 20   Ht 5' 5 (1.651 m)   Wt 104 lb (47.2 kg)   SpO2 99%   BMI 17.31 kg/m   Visual Acuity Right Eye Distance:   Left Eye Distance:   Bilateral Distance:    Right Eye Near:   Left Eye Near:    Bilateral Near:     Physical Exam Vitals reviewed.  Constitutional:      General: She is awake. She is in acute distress.     Appearance: She is well-developed and well-groomed. She is not toxic-appearing or diaphoretic.     Comments: Patient appears to be in significant distress and pain.  She has difficulty with standing from a seated position due  to left leg pain  HENT:     Head: Normocephalic and atraumatic.  Eyes:  General: Lids are normal. Gaze aligned appropriately.     Extraocular Movements: Extraocular movements intact.     Conjunctiva/sclera: Conjunctivae normal.  Pulmonary:     Effort: Pulmonary effort is normal.  Musculoskeletal:     Lumbar back: No spasms, tenderness or bony tenderness. Normal range of motion.     Right hip: Normal.     Left hip: Tenderness present. No deformity or lacerations. Decreased range of motion.     Right upper leg: Normal.     Left upper leg: Tenderness present. No swelling or edema.     Right lower leg: Normal.     Left lower leg: No swelling, deformity or tenderness. No edema.     Left ankle: No swelling or ecchymosis. No tenderness. Decreased range of motion.     Left Achilles Tendon: No tenderness.     Comments: Left thigh circumference: 37 cm  Right thigh circumference: 39 cm  Left calf circumference: 25 cm Right calf circumference: 26 cm   Palpation of the thoracic, lumbar spines does not reveal obvious step-offs or abnormalities.  Patient declines notable TTP of the thoracic and lumbar spines as well as bilateral SI joints. Visual inspection of lower extremities does not reveal obvious signs of bruising, swelling, edema.  Neurological:     Mental Status: She is alert and oriented to person, place, and time.  Psychiatric:        Attention and Perception: Attention and perception normal.        Mood and Affect: Mood and affect normal.        Speech: Speech normal.        Behavior: Behavior normal. Behavior is cooperative.      UC Treatments / Results  Labs (all labs ordered are listed, but only abnormal results are displayed) Labs Reviewed - No data to display  EKG   Radiology No results found.  Procedures Procedures (including critical care time)  Medications Ordered in UC Medications - No data to display  Initial Impression / Assessment and Plan / UC Course   I have reviewed the triage vital signs and the nursing notes.  Pertinent labs & imaging results that were available during my care of the patient were reviewed by me and considered in my medical decision making (see chart for details).      Final Clinical Impressions(s) / UC Diagnoses   Final diagnoses:  Acute leg pain, left  Suspected deep vein thrombosis (DVT)   Suspected deep vein thrombosis of the left lower extremity Severe cramping and aching pain in the left leg, worsening since Friday, with sharp pain upon movement. No significant swelling or bruising. Pain described as a toothache, affecting sleep. No numbness or tingling. Differential diagnosis includes deep vein thrombosis (DVT) due to severity of pain and lack of obvious injury or back pain. Concern for DVT due to deep ache and sharp pain upon movement.  Offered Tylenol  to assist with pain but patient declined this stating that she would prefer to wait until she gets to the ER for pain control. - Referred to emergency room or med center for ultrasound to evaluate for DVT. - Advised on potential initiation of anticoagulation if DVT is confirmed. - Provided list of med centers for quicker access to ultrasound and pain management. - Offered assistance with transportation if needed-she declines EMS transportation and states that her son is here and will take her to the ER.   Discharge Instructions      VISIT SUMMARY:  You  came in today because of severe pain in your left leg that started on Friday and worsened by Saturday. The pain is deep and aching, sometimes becoming sharp with movement, and it has been affecting your sleep. You have not noticed any swelling, bruising, numbness, or tingling.  YOUR PLAN:  -SUSPECTED DEEP VEIN THROMBOSIS (DVT) OF THE LEFT LOWER EXTREMITY: Deep vein thrombosis (DVT) is a condition where a blood clot forms in a deep vein, usually in the legs. Given the severity and nature of your pain, we are  concerned about the possibility of DVT. You need to go to the emergency room or a medical center for an ultrasound to check for DVT. If DVT is confirmed, you may need to start on blood thinners. We have provided you with a list of medical centers for quicker access to an ultrasound and pain management. If you need help with transportation, please let us  know.  INSTRUCTIONS:  Please go to the emergency room or a medical center as soon as possible for an ultrasound to evaluate for DVT. Follow the advice given at the medical center, and if DVT is confirmed, you may need to start on blood thinners. If you need assistance with transportation, contact our office.     ED Prescriptions   None    PDMP not reviewed this encounter.    Latorsha Curling E, PA-C 11/04/24 1130     [1]  Social History Tobacco Use   Smoking status: Some Days    Current packs/day: 0.50    Average packs/day: 0.5 packs/day for 20.0 years (10.0 ttl pk-yrs)    Types: Cigarettes   Smokeless tobacco: Never  Vaping Use   Vaping status: Never Used  Substance Use Topics   Alcohol use: Yes    Alcohol/week: 2.0 standard drinks of alcohol    Types: 2 Standard drinks or equivalent per week    Comment: occasional   Drug use: No     Maudene Stotler, Rocky BRAVO, PA-C 11/04/24 1130

## 2024-11-04 NOTE — Discharge Instructions (Addendum)
 Take 4 over the counter ibuprofen tablets 3 times a day or 2 over-the-counter naproxen tablets twice a day for pain. Also take tylenol  1000mg (2 extra strength) four times a day.   Please return for fever back pain difficulty urinating or moving your bowels.  Please follow-up with your family doctor in the office.

## 2024-11-04 NOTE — ED Triage Notes (Addendum)
 Pt presents with a chief complaint of left upper thigh pain. Day three of symptoms. No recent injuries. States the pain is unbearable. Rates overall pain a 6/10. Pain increases with any body movement. Has took Aspirin  + applied the roll on icy hot with no improvement/relief. Does endorse SOB. Unsteady gait to triage room.

## 2024-11-04 NOTE — ED Provider Notes (Signed)
 [place] [facility] AT [facility] Provider Note   CSN: [id number] Arrival date & time: 11/04/24  1154     Patient presents with: Leg Pain   Candice King is a 53 y.o. female.   53 yo F with a chief complaints of left leg pain.  Going on for about 3 days now.  Hurts when she moves it she thinks it hurts mostly to the anterior lateral aspect of the leg.  Hurts a lot when she tries to AB duct the leg.  She denies trauma.  Denies back pain.  She went to urgent care and they sent her here for evaluation for DVT   Leg Pain      Prior to Admission medications  Medication Sig Start Date End Date Taking? Authorizing Provider  methocarbamol  (ROBAXIN ) 500 MG tablet Take 1 tablet (500 mg total) by mouth 2 (two) times daily. 11/04/24  Yes Emil Share, DO  aspirin  EC 81 MG tablet Take 1 tablet (81 mg total) by mouth daily. Swallow whole. 09/10/24   HiltyVinie BROCKS, MD  Biotin w/ Vitamins C & E (HAIR SKIN & NAILS GUMMIES PO) Take 2 each by mouth daily.    [provider]  Doxepin  HCl 3 MG TABS Take 1 tablet (3 mg total) by mouth at bedtime. 10/05/24   Elnor Lauraine BRAVO, NP  Evolocumab  (REPATHA  SURECLICK) 140 MG/ML SOAJ Inject 140 mg into the skin every 14 (fourteen) days. 10/01/24   Hilty, Vinie BROCKS, MD  ibuprofen (ADVIL,MOTRIN) 800 MG tablet Take 800 mg by mouth every 8 (eight) hours as needed for headache or moderate pain (pain score 4-6).    [provider]  semaglutide -weight management (WEGOVY ) 1.7 MG/0.75ML SOAJ SQ injection Inject 1.7 mg into the skin once a week. 08/22/24   Elnor Lauraine BRAVO, NP  traZODone  (DESYREL ) 50 MG tablet Take 1 tablet by mouth at bedtime x 7 days, then take 0.5 tablets by mouth at bedtime x 7 days, then stop 10/05/24   Elnor Lauraine BRAVO, NP    Allergies: Patient has no known allergies.    Review of Systems  Updated Vital Signs BP 116/88 (BP Location: Left Arm)   Pulse (!) 109   Temp 99.5 F (37.5 C) (Oral)   Resp 16   SpO2 100%    Physical Exam Vitals and nursing note reviewed.  Constitutional:      General: She is not in acute distress.    Appearance: She is well-developed. She is not diaphoretic.  HENT:     Head: Normocephalic and atraumatic.  Eyes:     Pupils: Pupils are equal, round, and reactive to light.  Cardiovascular:     Rate and Rhythm: Normal rate and regular rhythm.     Heart sounds: No murmur heard.    No friction rub. No gallop.  Pulmonary:     Effort: Pulmonary effort is normal.     Breath sounds: No wheezing or rales.  Abdominal:     General: There is no distension.     Palpations: Abdomen is soft.     Tenderness: There is no abdominal tenderness.  Musculoskeletal:        General: Tenderness present.     Cervical back: Normal range of motion and neck supple.     Comments: Right thigh pain.  Hard to localize on exam.  Patient hurts no matter what direction I move her hip.  Pulse motor and sensation intact distally  Skin:    General: Skin is  warm and dry.  Neurological:     Mental Status: She is alert and oriented to person, place, and time.  Psychiatric:        Behavior: Behavior normal.     (all labs ordered are listed, but only abnormal results are displayed) Labs Reviewed - No data to display  EKG: None  Radiology: DG Hip Unilat W or Wo Pelvis 2-3 Views Left Result Date: 11/04/2024 CLINICAL DATA:  Left leg pain from hip to knee. EXAM: DG HIP (WITH OR WITHOUT PELVIS) 2-3V LEFT COMPARISON:  07/12/2004. FINDINGS: There is no evidence of acute fracture or dislocation. Mild degenerative changes are noted at the hips bilaterally. IMPRESSION: No acute fracture or dislocation. Electronically Signed   By: Leita Birmingham M.D.   On: 11/04/2024 14:00     Procedures   Medications Ordered in the ED  acetaminophen  (TYLENOL ) tablet 1,000 mg (1,000 mg Oral Given 11/04/24 1321)  ketorolac  (TORADOL ) 15 MG/ML injection 15 mg (15 mg Intramuscular Given 11/04/24 1323)  oxyCODONE  (Oxy  IR/ROXICODONE ) immediate release tablet 5 mg (5 mg Oral Given 11/04/24 1321)                                    Medical Decision Making Amount and/or Complexity of Data Reviewed Radiology: ordered.  Risk OTC drugs. Prescription drug management.   53 yo F with a cc of left anterior thigh pain.  This been going on for about 3 days.  Denies injury worse with movement palpation and twisting.  Most likely muscular by history.  Seen at urgent care and was sent here to rule out DVT.  I think extremely unlikely to be DVT.  Discussed this with the patient.  Will obtain a plain film of the hip to assess for ulterior pathology.  Plain film of the hip on my independent interpretation without fracture.  DVT study here is negative.  Will treat as muscular strain.  Crutches PCP follow-up.  2:31 PM:  I have discussed the diagnosis/risks/treatment options with the patient.  Evaluation and diagnostic testing in the emergency department does not suggest an emergent condition requiring admission or immediate intervention beyond what has been performed at this time.  They will follow up with PCP. We also discussed returning to the ED immediately if new or worsening sx occur. We discussed the sx which are most concerning (e.g., sudden worsening pain, fever, inability to tolerate by mouth) that necessitate immediate return. Medications administered to the patient during their visit and any new prescriptions provided to the patient are listed below.  Medications given during this visit Medications  acetaminophen  (TYLENOL ) tablet 1,000 mg (1,000 mg Oral Given 11/04/24 1321)  ketorolac  (TORADOL ) 15 MG/ML injection 15 mg (15 mg Intramuscular Given 11/04/24 1323)  oxyCODONE  (Oxy IR/ROXICODONE ) immediate release tablet 5 mg (5 mg Oral Given 11/04/24 1321)     The patient appears reasonably screen and/or stabilized for discharge and I doubt any other medical condition or other [facility] requiring further screening,  evaluation, or treatment in the ED at this time prior to discharge.       Final diagnoses:  Left thigh pain    ED Discharge Orders          Ordered    methocarbamol  (ROBAXIN ) 500 MG tablet  2 times daily        11/04/24 1430  Emil Share, DO 11/04/24 1431

## 2024-11-04 NOTE — ED Triage Notes (Signed)
 C/o left upper leg pain x 3 days. Sent from [facility] for DVT r/o.

## 2024-11-05 ENCOUNTER — Other Ambulatory Visit (HOSPITAL_COMMUNITY): Payer: Self-pay

## 2024-11-12 ENCOUNTER — Encounter (HOSPITAL_BASED_OUTPATIENT_CLINIC_OR_DEPARTMENT_OTHER): Payer: Self-pay | Admitting: Radiology

## 2024-11-12 ENCOUNTER — Other Ambulatory Visit (HOSPITAL_BASED_OUTPATIENT_CLINIC_OR_DEPARTMENT_OTHER): Payer: Self-pay | Admitting: Nurse Practitioner

## 2024-11-12 ENCOUNTER — Other Ambulatory Visit (HOSPITAL_COMMUNITY): Payer: Self-pay

## 2024-11-12 ENCOUNTER — Ambulatory Visit (HOSPITAL_BASED_OUTPATIENT_CLINIC_OR_DEPARTMENT_OTHER)
Admission: RE | Admit: 2024-11-12 | Discharge: 2024-11-12 | Disposition: A | Discharge status: Home / Self Care | Source: Ambulatory Visit

## 2024-11-12 DIAGNOSIS — Z1231 Encounter for screening mammogram for malignant neoplasm of breast: Secondary | ICD-10-CM

## 2024-11-19 ENCOUNTER — Ambulatory Visit: Payer: Self-pay | Admitting: Nurse Practitioner

## 2024-12-14 ENCOUNTER — Other Ambulatory Visit: Payer: Self-pay | Admitting: Nurse Practitioner

## 2024-12-14 DIAGNOSIS — G4700 Insomnia, unspecified: Secondary | ICD-10-CM

## 2024-12-15 ENCOUNTER — Other Ambulatory Visit: Payer: Self-pay | Admitting: Nurse Practitioner

## 2024-12-15 DIAGNOSIS — Z6830 Body mass index (BMI) 30.0-30.9, adult: Secondary | ICD-10-CM

## 2024-12-17 ENCOUNTER — Other Ambulatory Visit (HOSPITAL_COMMUNITY): Payer: Self-pay

## 2024-12-17 ENCOUNTER — Other Ambulatory Visit: Payer: Self-pay

## 2024-12-17 MED ORDER — WEGOVY 1.7 MG/0.75ML ~~LOC~~ SOAJ
1.7000 mg | SUBCUTANEOUS | 2 refills | Status: AC
  Filled 2024-12-17 – 2024-12-21 (×2): qty 3, 28d supply, fill #0

## 2024-12-17 MED ORDER — DOXEPIN HCL 3 MG PO TABS
3.0000 mg | ORAL_TABLET | Freq: Every day | ORAL | 1 refills | Status: AC
  Filled 2024-12-17: qty 90, 90d supply, fill #0

## 2024-12-18 ENCOUNTER — Other Ambulatory Visit (HOSPITAL_COMMUNITY): Payer: Self-pay

## 2024-12-18 ENCOUNTER — Other Ambulatory Visit: Payer: Self-pay

## 2024-12-19 ENCOUNTER — Other Ambulatory Visit (HOSPITAL_COMMUNITY): Payer: Self-pay

## 2024-12-22 ENCOUNTER — Other Ambulatory Visit (HOSPITAL_COMMUNITY): Payer: Self-pay

## 2025-01-18 ENCOUNTER — Ambulatory Visit: Admitting: Nurse Practitioner
